# Patient Record
Sex: Female | Born: 1980 | Race: Black or African American | Hispanic: No | Marital: Single | Smoking: Never smoker
Health system: Southern US, Community
[De-identification: ages and names within clinical notes are randomized; demographics above are authoritative.]

## PROBLEM LIST (undated history)

## (undated) DIAGNOSIS — R011 Cardiac murmur, unspecified: Secondary | ICD-10-CM

## (undated) DIAGNOSIS — Z789 Other specified health status: Secondary | ICD-10-CM

## (undated) DIAGNOSIS — D500 Iron deficiency anemia secondary to blood loss (chronic): Secondary | ICD-10-CM

## (undated) HISTORY — DX: Iron deficiency anemia secondary to blood loss (chronic): D50.0

## (undated) HISTORY — PX: NO PAST SURGERIES: SHX2092

---

## 2002-05-10 ENCOUNTER — Emergency Department (HOSPITAL_COMMUNITY): Admission: EM | Admit: 2002-05-10 | Discharge: 2002-05-10 | Payer: Self-pay | Admitting: Emergency Medicine

## 2010-07-06 ENCOUNTER — Emergency Department (HOSPITAL_COMMUNITY)
Admission: EM | Admit: 2010-07-06 | Discharge: 2010-07-07 | Disposition: A | Discharge status: Home / Self Care | Payer: Managed Care, Other (non HMO) | Attending: Emergency Medicine | Admitting: Emergency Medicine

## 2010-07-06 DIAGNOSIS — S025XXA Fracture of tooth (traumatic), initial encounter for closed fracture: Secondary | ICD-10-CM | POA: Insufficient documentation

## 2010-07-06 DIAGNOSIS — S0993XA Unspecified injury of face, initial encounter: Secondary | ICD-10-CM | POA: Insufficient documentation

## 2010-07-06 DIAGNOSIS — Y92838 Other recreation area as the place of occurrence of the external cause: Secondary | ICD-10-CM | POA: Insufficient documentation

## 2010-07-06 DIAGNOSIS — IMO0002 Reserved for concepts with insufficient information to code with codable children: Secondary | ICD-10-CM | POA: Insufficient documentation

## 2010-07-06 DIAGNOSIS — R220 Localized swelling, mass and lump, head: Secondary | ICD-10-CM | POA: Insufficient documentation

## 2010-07-06 DIAGNOSIS — K089 Disorder of teeth and supporting structures, unspecified: Secondary | ICD-10-CM | POA: Insufficient documentation

## 2010-07-06 DIAGNOSIS — R51 Headache: Secondary | ICD-10-CM | POA: Insufficient documentation

## 2010-07-06 DIAGNOSIS — K0889 Other specified disorders of teeth and supporting structures: Secondary | ICD-10-CM | POA: Insufficient documentation

## 2010-07-06 DIAGNOSIS — Y9239 Other specified sports and athletic area as the place of occurrence of the external cause: Secondary | ICD-10-CM | POA: Insufficient documentation

## 2010-07-07 ENCOUNTER — Emergency Department (HOSPITAL_COMMUNITY): Payer: Managed Care, Other (non HMO)

## 2013-08-14 ENCOUNTER — Ambulatory Visit (INDEPENDENT_AMBULATORY_CARE_PROVIDER_SITE_OTHER): Payer: Managed Care, Other (non HMO)

## 2013-08-14 ENCOUNTER — Ambulatory Visit (INDEPENDENT_AMBULATORY_CARE_PROVIDER_SITE_OTHER): Payer: Managed Care, Other (non HMO) | Admitting: Sports Medicine

## 2013-08-14 ENCOUNTER — Encounter: Payer: Self-pay | Admitting: Sports Medicine

## 2013-08-14 VITALS — BP 147/87 | HR 61 | Ht 64.0 in | Wt 184.0 lb

## 2013-08-14 DIAGNOSIS — Y9364 Activity, baseball: Secondary | ICD-10-CM

## 2013-08-14 DIAGNOSIS — S6990XA Unspecified injury of unspecified wrist, hand and finger(s), initial encounter: Secondary | ICD-10-CM

## 2013-08-14 DIAGNOSIS — S6980XA Other specified injuries of unspecified wrist, hand and finger(s), initial encounter: Secondary | ICD-10-CM

## 2013-08-14 DIAGNOSIS — S6992XA Unspecified injury of left wrist, hand and finger(s), initial encounter: Secondary | ICD-10-CM | POA: Insufficient documentation

## 2013-08-14 DIAGNOSIS — IMO0002 Reserved for concepts with insufficient information to code with codable children: Secondary | ICD-10-CM

## 2013-08-14 HISTORY — DX: Unspecified injury of left wrist, hand and finger(s), initial encounter: S69.92XA

## 2013-08-14 MED ORDER — HYDROCODONE-ACETAMINOPHEN 5-325 MG PO TABS: 1.0000 | ORAL_TABLET | Freq: Three times a day (TID) | ORAL | Status: DC | PRN

## 2013-08-14 NOTE — Progress Notes (Signed)
  Subjective:    CC: Hand injury  HPI:  Several days ago this pleasant 33 year-old female was playing softball, and injured her left fifth finger while catching a ball. She had immediate pain, swelling, and deformity which she immediately corrected herself. She came to me for further evaluation and definitive treatment. Pain is severe, persistent, and she has limited function. Pain is localized predominately over the proximal interphalangeal joint.  Past medical history, Surgical history, Family history not pertinant except as noted below, Social history, Allergies, and medications have been entered into the medical record, reviewed, and no changes needed.   Review of Systems: No headache, visual changes, nausea, vomiting, diarrhea, constipation, dizziness, abdominal pain, skin rash, fevers, chills, night sweats, swollen lymph nodes, weight loss, chest pain, body aches, joint swelling, muscle aches, shortness of breath, mood changes, visual or auditory hallucinations.  Objective:    General: Well Developed, well nourished, and in no acute distress.  Neuro: Alert and oriented x3, extra-ocular muscles intact, sensation grossly intact.  HEENT: Normocephalic, atraumatic, pupils equal round reactive to light, neck supple, no masses, no lymphadenopathy, thyroid nonpalpable.  Skin: Warm and dry, no rashes noted.  Cardiac: Regular rate and rhythm, no murmurs rubs or gallops.  Respiratory: Clear to auscultation bilaterally. Not using accessory muscles, speaking in full sentences.  Abdominal: Soft, nontender, nondistended, positive bowel sounds, no masses, no organomegaly.  Left hand: There is bruising and swelling with tenderness to palpation at the proximal interphalangeal joint of the left fifth finger. She does have some strength to extension and flexion at the MCP, PIP, and PIP however it's a week and does reproduce severe pain. She is neurovascularly intact distally.  X-rays were personally  reviewed and do show a comminuted intra-articular fracture at the PIP at the base of the middle phalanx with greater than 50% articular involvement and approximately 100% dorsal subluxation of the dorsal fragment.  Impression and Recommendations:    The patient was counselled, risk factors were discussed, anticipatory guidance given.

## 2013-08-14 NOTE — Assessment & Plan Note (Addendum)
With significant pain around the proximal interphalangeal joint. I do suspect that this is fractured.  Extension splint placed, x-rays. Return in 2 weeks. Right-handed duty only at work.  Once x-rays were reviewed, I do see a severe fracture with greater than 50% involvement of the articular surface of the base of the middle phalanx, with fairly severe dorsal subluxation of the dorsal fragment. This will not do well with nonsurgical measures, I discussed this with Dr. Grandville Silos with [facility], he agrees to see her tomorrow morning at 8:30 in the morning, n.p.o., and will likely operate tomorrow afternoon. I called the patient to discuss this, and she agrees to present at [facility] n.p.o. in the morning.

## 2013-08-15 ENCOUNTER — Encounter (HOSPITAL_BASED_OUTPATIENT_CLINIC_OR_DEPARTMENT_OTHER): Payer: Self-pay | Admitting: *Deleted

## 2013-08-15 ENCOUNTER — Encounter (HOSPITAL_BASED_OUTPATIENT_CLINIC_OR_DEPARTMENT_OTHER)
Admission: RE | Disposition: A | Discharge status: Home / Self Care | Payer: Self-pay | Source: Ambulatory Visit | Attending: Orthopedic Surgery

## 2013-08-15 ENCOUNTER — Ambulatory Visit (HOSPITAL_BASED_OUTPATIENT_CLINIC_OR_DEPARTMENT_OTHER)
Admission: RE | Admit: 2013-08-15 | Discharge: 2013-08-15 | Disposition: A | Discharge status: Home / Self Care | Payer: Managed Care, Other (non HMO) | Source: Ambulatory Visit | Attending: Orthopedic Surgery | Admitting: Orthopedic Surgery

## 2013-08-15 ENCOUNTER — Ambulatory Visit (HOSPITAL_COMMUNITY): Payer: Managed Care, Other (non HMO)

## 2013-08-15 ENCOUNTER — Other Ambulatory Visit: Payer: Self-pay | Admitting: Orthopedic Surgery

## 2013-08-15 ENCOUNTER — Ambulatory Visit (HOSPITAL_BASED_OUTPATIENT_CLINIC_OR_DEPARTMENT_OTHER): Payer: Managed Care, Other (non HMO) | Admitting: Anesthesiology

## 2013-08-15 ENCOUNTER — Encounter (HOSPITAL_BASED_OUTPATIENT_CLINIC_OR_DEPARTMENT_OTHER): Payer: Managed Care, Other (non HMO) | Admitting: Anesthesiology

## 2013-08-15 DIAGNOSIS — W219XXA Striking against or struck by unspecified sports equipment, initial encounter: Secondary | ICD-10-CM | POA: Insufficient documentation

## 2013-08-15 DIAGNOSIS — S63279A Dislocation of unspecified interphalangeal joint of unspecified finger, initial encounter: Secondary | ICD-10-CM | POA: Insufficient documentation

## 2013-08-15 DIAGNOSIS — Y9239 Other specified sports and athletic area as the place of occurrence of the external cause: Secondary | ICD-10-CM | POA: Insufficient documentation

## 2013-08-15 DIAGNOSIS — Y92838 Other recreation area as the place of occurrence of the external cause: Secondary | ICD-10-CM

## 2013-08-15 HISTORY — DX: Other specified health status: Z78.9

## 2013-08-15 HISTORY — DX: Cardiac murmur, unspecified: R01.1

## 2013-08-15 HISTORY — PX: OPEN REDUCTION INTERNAL FIXATION (ORIF) METACARPAL: SHX6234

## 2013-08-15 LAB — POCT HEMOGLOBIN-HEMACUE: HEMOGLOBIN: 12.7 g/dL (ref 12.0–15.0)

## 2013-08-15 SURGERY — OPEN REDUCTION INTERNAL FIXATION (ORIF) METACARPAL
Anesthesia: General | Site: Finger | Laterality: Left

## 2013-08-15 MED ORDER — MIDAZOLAM HCL 2 MG/2ML IJ SOLN: 1.0000 mg | INTRAMUSCULAR | Status: DC | PRN

## 2013-08-15 MED ORDER — FENTANYL CITRATE 0.05 MG/ML IJ SOLN: 50.0000 ug | INTRAMUSCULAR | Status: DC | PRN

## 2013-08-15 MED ORDER — FENTANYL CITRATE 0.05 MG/ML IJ SOLN
INTRAMUSCULAR | Status: AC
  Filled 2013-08-15: qty 4

## 2013-08-15 MED ORDER — PROPOFOL 10 MG/ML IV BOLUS
INTRAVENOUS | Status: AC
  Filled 2013-08-15: qty 20

## 2013-08-15 MED ORDER — ONDANSETRON HCL 4 MG/2ML IJ SOLN
INTRAMUSCULAR | Status: DC | PRN
  Administered 2013-08-15: 4 mg via INTRAVENOUS

## 2013-08-15 MED ORDER — DEXAMETHASONE SODIUM PHOSPHATE 4 MG/ML IJ SOLN
INTRAMUSCULAR | Status: DC | PRN
  Administered 2013-08-15: 10 mg via INTRAVENOUS

## 2013-08-15 MED ORDER — FENTANYL CITRATE 0.05 MG/ML IJ SOLN
INTRAMUSCULAR | Status: DC | PRN
  Administered 2013-08-15 (×2): 25 ug via INTRAVENOUS
  Administered 2013-08-15: 100 ug via INTRAVENOUS
  Administered 2013-08-15: 25 ug via INTRAVENOUS

## 2013-08-15 MED ORDER — HYDROMORPHONE HCL PF 1 MG/ML IJ SOLN: 0.2500 mg | INTRAMUSCULAR | Status: DC | PRN

## 2013-08-15 MED ORDER — OXYCODONE HCL 5 MG PO TABS: 5.0000 mg | ORAL_TABLET | Freq: Once | ORAL | Status: DC | PRN

## 2013-08-15 MED ORDER — BUPIVACAINE-EPINEPHRINE 0.5% -1:200000 IJ SOLN
INTRAMUSCULAR | Status: DC | PRN
  Administered 2013-08-15: 10 mL

## 2013-08-15 MED ORDER — CEFAZOLIN SODIUM-DEXTROSE 2-3 GM-% IV SOLR
2.0000 g | INTRAVENOUS | Status: AC
  Administered 2013-08-15: 2 g via INTRAVENOUS

## 2013-08-15 MED ORDER — CHLORHEXIDINE GLUCONATE 4 % EX LIQD: 60.0000 mL | Freq: Once | CUTANEOUS | Status: DC

## 2013-08-15 MED ORDER — LIDOCAINE HCL (CARDIAC) 20 MG/ML IV SOLN
INTRAVENOUS | Status: DC | PRN
  Administered 2013-08-15: 60 mg via INTRAVENOUS

## 2013-08-15 MED ORDER — LACTATED RINGERS IV SOLN
INTRAVENOUS | Status: DC
  Administered 2013-08-15: 12:00:00 via INTRAVENOUS

## 2013-08-15 MED ORDER — OXYCODONE HCL 5 MG/5ML PO SOLN: 5.0000 mg | Freq: Once | ORAL | Status: DC | PRN

## 2013-08-15 MED ORDER — MIDAZOLAM HCL 5 MG/5ML IJ SOLN
INTRAMUSCULAR | Status: DC | PRN
  Administered 2013-08-15: 2 mg via INTRAVENOUS

## 2013-08-15 MED ORDER — BUPIVACAINE-EPINEPHRINE (PF) 0.5% -1:200000 IJ SOLN
INTRAMUSCULAR | Status: AC
  Filled 2013-08-15: qty 30

## 2013-08-15 MED ORDER — PROPOFOL 10 MG/ML IV BOLUS
INTRAVENOUS | Status: DC | PRN
  Administered 2013-08-15: 150 mg via INTRAVENOUS

## 2013-08-15 MED ORDER — CEFAZOLIN SODIUM-DEXTROSE 2-3 GM-% IV SOLR
INTRAVENOUS | Status: AC
  Filled 2013-08-15: qty 50

## 2013-08-15 MED ORDER — MIDAZOLAM HCL 2 MG/2ML IJ SOLN
INTRAMUSCULAR | Status: AC
  Filled 2013-08-15: qty 2

## 2013-08-15 MED ORDER — LACTATED RINGERS IV SOLN
INTRAVENOUS | Status: DC
  Administered 2013-08-15 (×2): via INTRAVENOUS

## 2013-08-15 SURGICAL SUPPLY — 47 items
BANDAGE COBAN STERILE 2 (GAUZE/BANDAGES/DRESSINGS) IMPLANT
BLADE MINI RND TIP GREEN BEAV (BLADE) IMPLANT
BLADE SURG 15 STRL LF DISP TIS (BLADE) ×1 IMPLANT
BLADE SURG 15 STRL SS (BLADE) ×3
BNDG CMPR 9X4 STRL LF SNTH (GAUZE/BANDAGES/DRESSINGS) ×1
BNDG COHESIVE 4X5 TAN STRL (GAUZE/BANDAGES/DRESSINGS) ×3 IMPLANT
BNDG ESMARK 4X9 LF (GAUZE/BANDAGES/DRESSINGS) ×3 IMPLANT
BNDG GAUZE ELAST 4 BULKY (GAUZE/BANDAGES/DRESSINGS) ×6 IMPLANT
CANISTER SUCTION 1200CC (MISCELLANEOUS) IMPLANT
CHLORAPREP W/TINT 26ML (MISCELLANEOUS) ×3 IMPLANT
CORDS BIPOLAR (ELECTRODE) ×3 IMPLANT
COVER MAYO STAND STRL (DRAPES) ×3 IMPLANT
COVER TABLE BACK 60X90 (DRAPES) ×3 IMPLANT
CUFF TOURNIQUET SINGLE 18IN (TOURNIQUET CUFF) ×2 IMPLANT
DRAPE C-ARM 42X72 X-RAY (DRAPES) ×3 IMPLANT
DRAPE EXTREMITY T 121X128X90 (DRAPE) ×3 IMPLANT
DRAPE SURG 17X23 STRL (DRAPES) ×3 IMPLANT
DRSG EMULSION OIL 3X3 NADH (GAUZE/BANDAGES/DRESSINGS) ×3 IMPLANT
GAUZE SPONGE 4X4 12PLY STRL (GAUZE/BANDAGES/DRESSINGS) ×3 IMPLANT
GLOVE BIO SURGEON STRL SZ7.5 (GLOVE) ×3 IMPLANT
GLOVE BIOGEL PI IND STRL 7.0 (GLOVE) IMPLANT
GLOVE BIOGEL PI IND STRL 8 (GLOVE) ×1 IMPLANT
GLOVE BIOGEL PI INDICATOR 7.0 (GLOVE) ×2
GLOVE BIOGEL PI INDICATOR 8 (GLOVE) ×2
GLOVE ECLIPSE 6.5 STRL STRAW (GLOVE) ×2 IMPLANT
GLOVE EXAM NITRILE PF MED BLUE (GLOVE) ×2 IMPLANT
GOWN STRL REUS W/ TWL LRG LVL3 (GOWN DISPOSABLE) ×2 IMPLANT
GOWN STRL REUS W/TWL LRG LVL3 (GOWN DISPOSABLE) ×6
GUIDEWIRE THREADED 1.6 (WIRE) ×4 IMPLANT
NEEDLE HYPO 22GX1.5 SAFETY (NEEDLE) ×2 IMPLANT
NS IRRIG 1000ML POUR BTL (IV SOLUTION) ×3 IMPLANT
PACK BASIN DAY SURGERY FS (CUSTOM PROCEDURE TRAY) ×3 IMPLANT
PADDING CAST ABS 4INX4YD NS (CAST SUPPLIES) ×2
PADDING CAST ABS COTTON 4X4 ST (CAST SUPPLIES) IMPLANT
RUBBERBAND STERILE (MISCELLANEOUS) ×2 IMPLANT
SLEEVE SCD COMPRESS KNEE MED (MISCELLANEOUS) ×3 IMPLANT
STOCKINETTE 4X48 STRL (DRAPES) ×3 IMPLANT
SUCTION FRAZIER TIP 10 FR DISP (SUCTIONS) IMPLANT
SUT VICRYL RAPIDE 4-0 (SUTURE) IMPLANT
SUT VICRYL RAPIDE 4/0 PS 2 (SUTURE) ×2 IMPLANT
SYR BULB 3OZ (MISCELLANEOUS) ×2 IMPLANT
SYRINGE 10CC LL (SYRINGE) ×2 IMPLANT
TOWEL OR 17X24 6PK STRL BLUE (TOWEL DISPOSABLE) ×3 IMPLANT
TOWEL OR NON WOVEN STRL DISP B (DISPOSABLE) IMPLANT
TUBE CONNECTING 20'X1/4 (TUBING)
TUBE CONNECTING 20X1/4 (TUBING) IMPLANT
UNDERPAD 30X30 INCONTINENT (UNDERPADS AND DIAPERS) ×3 IMPLANT

## 2013-08-15 NOTE — Progress Notes (Signed)
No hx of infection

## 2013-08-15 NOTE — Discharge Instructions (Signed)
Discharge Instructions   You have a dressing with a plaster splint incorporated in it. Move your fingers as much as possible, making a full fist and fully opening the fist. Elevate your hand to reduce pain & swelling of the digits.  Ice over the operative site may be helpful to reduce pain & swelling.  DO NOT USE HEAT. Pain medicine has been prescribed for you.  Use your medicine as needed over the first 48 hours, and then you can begin to taper your use.  You may use Tylenol in place of your prescribed pain medication, but not IN ADDITION to it. Leave the dressing in place until you return to our office.  You may shower, but keep the bandage clean & dry.  You may drive a car when you are off of prescription pain medications and can safely control your vehicle with both hands. Our office will call you to arrange follow-up   Please call [phone] during normal business hours or [phone] after hours for any problems. Including the following:  - excessive redness of the incisions - drainage for more than 4 days - fever of more than 101.5 F  *Please note that pain medications will not be refilled after hours or on weekends.  NO WORK THRU 08-19-13, RETURNING TO WORK ON 08-20-13, BUT WITH THESE LEFT HAND RESTRICTIONS: NO LIFTING, GRIPPING, OR GRASPING MORE THAN PAPER/PENCIL TASKS   Post Anesthesia Home Care Instructions  Activity: Get plenty of rest for the remainder of the day. A responsible adult should stay with you for 24 hours following the procedure.  For the next 24 hours, DO NOT: -Drive a car -Paediatric nurse -Drink alcoholic beverages -Take any medication unless instructed by your physician -Make any legal decisions or sign important papers.  Meals: Start with liquid foods such as gelatin or soup. Progress to regular foods as tolerated. Avoid greasy, spicy, heavy foods. If nausea and/or vomiting occur, drink only clear liquids until the nausea and/or vomiting subsides.  Call your physician if vomiting continues.  Special Instructions/Symptoms: Your throat may feel dry or sore from the anesthesia or the breathing tube placed in your throat during surgery. If this causes discomfort, gargle with warm salt water. The discomfort should disappear within 24 hours.

## 2013-08-15 NOTE — H&P (Signed)
Grace Robertson is an 33 y.o. female.   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  She was evaluated and splinted, noted to have a PIP fracture dislocation.  Past Medical History  Diagnosis Date  . Medical history non-contributory   . Heart murmur     as baby    Past Surgical History  Procedure Laterality Date  . No past surgeries      History reviewed. No pertinent family history. Social History:  reports that she has never smoked. She has never used smokeless tobacco. She reports that she drinks alcohol. She reports that she does not use illicit drugs.  Allergies: No Known Allergies  Medications Prior to Admission  Medication Sig Dispense Refill  . HYDROcodone-acetaminophen (NORCO/VICODIN) 5-325 MG per tablet Take 1 tablet by mouth every 8 (eight) hours as needed for moderate pain.  40 tablet  0    No results found for this or any previous visit (from the past 48 hour(s)). Dg Finger Little Left  08/14/2013   CLINICAL DATA:  33 year old female status post blunt trauma with pain. Initial encounter.  EXAM: LEFT LITTLE FINGER 2+V  COMPARISON:  None.  FINDINGS: Comminuted fracture dislocation at the left fifth PIP. Intra-articular fracture with dorsal displacement of 1 full shaft with and impaction on the head of the fifth proximal phalanxa. Butterfly fragment with volar displacement. The DIP remains intact. No definite fracture at the head of the proximal phalanx. The fifth MCP is intact.  IMPRESSION: Comminuted fracture dislocation of the left fifth PIP, impacted and with dorsal displacement of 1 full shaft width.   Electronically Signed   By: Lars Pinks M.D.   On: 08/14/2013  16:03    Review of Systems  All other systems reviewed and are negative.   Blood pressure 147/113, pulse 75, temperature 98 F (36.7 C), temperature source Oral, resp. rate 20, height 5\' 4"  (1.626 m), weight 83.462 kg (184 lb), last menstrual period 07/14/2013, SpO2 97.00%. Physical Exam  Constitutional:  WD, WN, NAD HEENT:  NCAT, EOMI Neuro/Psych:  Alert & oriented to person, place, and time; appropriate mood & affect Lymphatic: No generalized UE edema or lymphadenopathy Extremities / MSK:  Both UE are normal with respect to appearance, ranges of motion, joint stability, muscle strength/tone, sensation, & perfusion except as otherwise noted:  Left small finger is swollen at the PIP, NVI, splint applied, grossly normal alignment & motion not attempted  Labs / Xrays:  No radiographic studies obtained today.  X-rays from yesterday are reviewed revealing a fracture dislocation of the PIP joint, with a rather large and somewhat comminuted volar fracture off the base of the middle phalanx and proximal dorsal dislocation of the remainder of the middle phalanx.  Assessment: Left small finger PIP fracture dislocation  Plan:  I discussed in fairly great detail the complexity and gravity of this injury.  We reviewed multiple methods of treatment, as well as the goals of restoring joint stability, articular congruity, and motion.  Methods such as the force couple splint, static pinning of the PIP joint in reduced position, and open methods of articular reconstruction were discussed, including hemi-hamate reconstruction.  It is my preference if  possible to employ the force couple splint, as I think in her situation it offers the best possible means for achieving all 3 objectives.  We will plan to proceed today at the [facility].    The details of the operative procedure were discussed with the patient.  Questions were invited and answered.  In addition to the goal of the procedure, the  risks of the procedure to include but not limited to bleeding; infection; damage to the nerves or blood vessels that could result in bleeding, numbness, weakness, chronic pain, and the need for additional procedures; stiffness; the need for revision surgery; and anesthetic risks, the worst of which is death, were reviewed.  No specific outcome was guaranteed or implied.  Informed consent was obtained.   Randee Upchurch A. 08/15/2013, 11:12 AM

## 2013-08-15 NOTE — Anesthesia Preprocedure Evaluation (Addendum)
Anesthesia Evaluation  Patient identified by MRN, date of birth, ID band Patient awake    Reviewed: Allergy & Precautions, H&P , NPO status , Patient's Chart, lab work & pertinent test results  Airway Mallampati: I  TM Distance: >3 FB Neck ROM: Full    Dental no notable dental hx. (+) Teeth Intact, Dental Advisory Given   Pulmonary neg pulmonary ROS,  breath sounds clear to auscultation  Pulmonary exam normal       Cardiovascular negative cardio ROS  Rhythm:Regular Rate:Normal     Neuro/Psych negative neurological ROS  negative psych ROS   GI/Hepatic negative GI ROS, Neg liver ROS,   Endo/Other  negative endocrine ROS  Renal/GU negative Renal ROS  negative genitourinary   Musculoskeletal   Abdominal   Peds  Hematology negative hematology ROS (+)   Anesthesia Other Findings   Reproductive/Obstetrics negative OB ROS                            Anesthesia Physical Anesthesia Plan  ASA: II  Anesthesia Plan: General   Post-op Pain Management:    Induction: Intravenous  Airway Management Planned: LMA  Additional Equipment:   Intra-op Plan:   Post-operative Plan: Extubation in OR  Informed Consent: I have reviewed the patients History and Physical, chart, labs and discussed the procedure including the risks, benefits and alternatives for the proposed anesthesia with the patient or authorized representative who has indicated his/her understanding and acceptance.   Dental advisory given  Plan Discussed with: CRNA  Anesthesia Plan Comments:         Anesthesia Quick Evaluation  

## 2013-08-15 NOTE — Anesthesia Postprocedure Evaluation (Signed)
  Anesthesia Post-op Note  Patient: Grace Robertson  Procedure(s) Performed: Procedure(s): OPEN REDUCTION INTERNAL FIXATION (ORIF) LEFT SMALL FINGER (Left)  Patient Location: PACU  Anesthesia Type:General  Level of Consciousness: awake and alert   Airway and Oxygen Therapy: Patient Spontanous Breathing  Post-op Pain: none  Post-op Assessment: Post-op Vital signs reviewed, Patient's Cardiovascular Status Stable and Respiratory Function Stable  Post-op Vital Signs: Reviewed  Filed Vitals:   08/15/13 1400  BP: 134/89  Pulse: 81  Temp:   Resp: 15    Complications: No apparent anesthesia complications

## 2013-08-15 NOTE — Transfer of Care (Signed)
Immediate Anesthesia Transfer of Care Note  Patient: Grace Robertson  Procedure(s) Performed: Procedure(s): OPEN REDUCTION INTERNAL FIXATION (ORIF) LEFT SMALL FINGER (Left)  Patient Location: PACU  Anesthesia Type:General  Level of Consciousness: awake, sedated and patient cooperative  Airway & Oxygen Therapy: Patient Spontanous Breathing and Patient connected to face mask oxygen  Post-op Assessment: Report given to PACU RN and Post -op Vital signs reviewed and stable  Post vital signs: Reviewed and stable  Complications: No apparent anesthesia complications

## 2013-08-15 NOTE — Op Note (Signed)
08/15/2013  12:38 PM  PATIENT:  Grace Robertson  33 y.o. female  PRE-OPERATIVE DIAGNOSIS:  Left small finger PIP fracture dislocation  POST-OPERATIVE DIAGNOSIS:  Same  PROCEDURE:  Application of multiplane or external fixation left small finger for treatment of left small finger PIP fracture dislocation (Agee Force-Couple Splint Fabrication)  SURGEON: Rayvon Char. Grandville Silos, MD  PHYSICIAN ASSISTANT: None  ANESTHESIA:  general  SPECIMENS:  None  DRAINS:   None  PREOPERATIVE INDICATIONS:  Grace Robertson is a  33 y.o. female with left small finger PIP fracture dislocation, with a rather large and slightly comminuted volar fracture component at the base of the middle phalanx  The risks benefits and alternatives were discussed with the patient preoperatively including but not limited to the risks of infection, bleeding, nerve injury, cardiopulmonary complications, the need for revision surgery, among others, and the patient verbalized understanding and consented to proceed.  OPERATIVE IMPLANTS: 0.045 inch K wire x2, and 0.062 inch threaded K wire x1  OPERATIVE PROCEDURE:  After receiving prophylactic antibiotics, the patient was escorted to the operative theatre and placed in a supine position.  General anesthesia was administered. A surgical "time-out" was performed during which the planned procedure, proposed operative site, and the correct patient identity were compared to the operative consent and agreement confirmed by the circulating nurse according to current facility policy.  Following application of a tourniquet to the operative extremity, the exposed skin was prepped with Chloraprep and draped in the usual sterile fashion.    Fluoroscopy was used to assess the adequacy of closed reduction. Closed reduction was actually quite good, But not stable. Decision was made to proceed with fabrication of an Agee Force-Couple splint. Using fluoroscopic guidance, 0.045 inch K wire was driven  through the head of the proximal phalanx in an effort to place it along the axis of rotation. Another was driven through the dorsal middle phalanx, near the proximal diametaphyseal junction. The last was a 0.062 inch threaded K wire driven from dorsal to volar more distally into the middle phalanx.  The K wires were appropriately bent into the splint design, and a rubber band applied. Tension was adjusted by determining how many redundant wraps that there would be within the rubber band.  Final images were obtained, revealing concentric reduction with good articular alignment both in the flexed and extended positions. Xeroform was placed around the pin sites and a bulky hand dressing was applied, leaving the pins exposed so that they would not catch upon the Kerlex.Digital block consisting of half percent Marcaine with epinephrine was instilled and the patient was awakened taken to recovery in stable condition, breathing spontaneously.  DISPOSITION: The patient be discharged home today with instructions to begin to work on active and passive range of motion, returning next week for reassessment. She should have evaluation under fluoroscopy at that visit.

## 2013-08-16 ENCOUNTER — Encounter (HOSPITAL_BASED_OUTPATIENT_CLINIC_OR_DEPARTMENT_OTHER): Payer: Self-pay | Admitting: Orthopedic Surgery

## 2013-08-28 ENCOUNTER — Ambulatory Visit: Payer: Managed Care, Other (non HMO) | Admitting: Sports Medicine

## 2019-03-05 ENCOUNTER — Other Ambulatory Visit: Payer: Self-pay

## 2019-03-05 ENCOUNTER — Encounter: Payer: Self-pay | Admitting: Osteopathic Medicine

## 2019-03-05 ENCOUNTER — Ambulatory Visit (INDEPENDENT_AMBULATORY_CARE_PROVIDER_SITE_OTHER): Payer: Managed Care, Other (non HMO) | Admitting: Osteopathic Medicine

## 2019-03-05 VITALS — BP 136/89 | HR 103 | Temp 98.7°F | Ht 64.0 in | Wt 191.0 lb

## 2019-03-05 DIAGNOSIS — Z832 Family history of diseases of the blood and blood-forming organs and certain disorders involving the immune mechanism: Secondary | ICD-10-CM | POA: Diagnosis not present

## 2019-03-05 DIAGNOSIS — D509 Iron deficiency anemia, unspecified: Secondary | ICD-10-CM

## 2019-03-05 DIAGNOSIS — F418 Other specified anxiety disorders: Secondary | ICD-10-CM | POA: Diagnosis not present

## 2019-03-05 DIAGNOSIS — D649 Anemia, unspecified: Secondary | ICD-10-CM | POA: Diagnosis not present

## 2019-03-05 DIAGNOSIS — N852 Hypertrophy of uterus: Secondary | ICD-10-CM

## 2019-03-05 MED ORDER — DIAZEPAM 5 MG PO TABS: 5.0000 mg | ORAL_TABLET | Freq: Once | ORAL | 0 refills | Status: AC

## 2019-03-05 NOTE — Patient Instructions (Addendum)
Plan:  Labs now!  Probably will need iron infusion   +/- blood transfusion depending on labs  You'll get a call from the [facility]  Will plan for pelvic/Pap w/ pelvic ultrasound sometime in next few weeks

## 2019-03-05 NOTE — Progress Notes (Signed)
HPI: Grace Robertson is a 38 y.o. female who  has a past medical history of Heart murmur and Medical history non-contributory.  [facility],  for chief complaint of: New to establish  Borderline BP Blood work    Anemia ion recent labs, Hgb 7.4 (pt has it on her phone, no official report printed). No other major abnormalities on labs. Pt notes SOB on exertion and fatigue. Mom and sister also have history of iron deficiency anemia requiring routine transfusions few times per year.      Past medical, surgical, social and family history reviewed:  Patient Active Problem List   Diagnosis Date Noted  . Injury of fifth finger, left 08/14/2013    Past Surgical History:  Procedure Laterality Date  . NO PAST SURGERIES    . OPEN REDUCTION INTERNAL FIXATION (ORIF) METACARPAL Left 08/15/2013   Procedure: OPEN REDUCTION INTERNAL FIXATION (ORIF) LEFT SMALL FINGER;  Surgeon: Jolyn Nap, MD;  Location: [facility];  Service: Orthopedics;  Laterality: Left;    Social History   Tobacco Use  . Smoking status: Never Smoker  . Smokeless tobacco: Never Used  Substance Use Topics  . Alcohol use: Yes    Comment: social    No family history on file.   Current medication list and allergy/intolerance information reviewed:    Current Outpatient Medications  Medication Sig Dispense Refill  . HYDROcodone-acetaminophen (NORCO/VICODIN) 5-325 MG per tablet Take 1 tablet by mouth every 8 (eight) hours as needed for moderate pain. (Patient not taking: Reported on 03/05/2019) 40 tablet 0   No current facility-administered medications for this visit.    No Known Allergies    Review of Systems:  Constitutional:  No  fever, no chills, No recent illness, No unintentional weight changes. +significant fatigue.   HEENT: No  headache, no vision change, no hearing change, No sore throat, No  sinus pressure  Cardiac:  No  chest pain, No  pressure, No palpitations, No  Orthopnea  Respiratory:  No  shortness of breath. No  Cough  Gastrointestinal: No  abdominal pain, No  nausea, No  vomiting,  No  blood in stool, No  diarrhea, No  constipation   Musculoskeletal: No new myalgia/arthralgia  Skin: No  Rash, No other wounds/concerning lesions  Genitourinary: No  incontinence, No  abnormal genital bleeding, No abnormal genital discharge  Hem/Onc: No  easy bruising/bleeding, No  abnormal lymph node  Endocrine: No cold intolerance,  No heat intolerance. No polyuria/polydipsia/polyphagia   Neurologic: No  weakness, No  dizziness, No  slurred speech/focal weakness/facial droop  Psychiatric: No  concerns with depression, No  concerns with anxiety, No sleep problems, No mood problems  Exam:  BP 136/89 (BP Location: Left Arm, Patient Position: Sitting, Cuff Size: Normal)   Pulse (!) 103   Temp 98.7 F (37.1 C) (Oral)   Ht 5\' 4"  (1.626 m)   Wt 191 lb 0.6 oz (86.7 kg)   BMI 32.79 kg/m   Constitutional: VS see above. General Appearance: alert, well-developed, well-nourished, NAD  Eyes: Normal lids and conjunctive, non-icteric sclera  Ears, Nose, Mouth, Throat: MMM, Normal external inspection ears/nares/mouth/lips/gums. TM normal bilaterally. Pharynx/tonsils no erythema, no exudate. Nasal mucosa normal.   Neck: No masses, trachea midline. No thyroid enlargement. No tenderness/mass appreciated. No lymphadenopathy  Respiratory: Normal respiratory effort. no wheeze, no rhonchi, no rales  Cardiovascular: S1/S2 normal, no murmur, no rub/gallop auscultated. RRR. No lower extremity edema. Pedal  pulse II/IV bilaterally DP and PT. No carotid bruit or JVD. No abdominal aortic bruit.  Gastrointestinal: Nontender, (+)suprapubic nontender mass/fullness. No hepatomegaly, no splenomegaly. No hernia appreciated. Bowel sounds normal. Rectal exam deferred.   Musculoskeletal: Gait normal. No clubbing/cyanosis of digits.    Neurological: Normal balance/coordination. No tremor. No cranial nerve deficit on limited exam. Motor and sensation intact and symmetric. Cerebellar reflexes intact.   Skin: warm, dry, intact. No rash/ulcer. No concerning nevi or subq nodules on limited exam.    Psychiatric: Normal judgment/insight. Normal mood and affect. Oriented x3.    Results for orders placed or performed in visit on 03/05/19 (from the past 72 hour(s))  CBC W/ DIFF.     Status: Abnormal   Collection Time: 03/05/19  5:00 PM  Result Value Ref Range   WBC 4.2 3.8 - 10.8 Thousand/uL   RBC 4.35 3.80 - 5.10 Million/uL   Hemoglobin 6.8 (L) 11.7 - 15.5 g/dL    Comment: Verified by repeat analysis. Marland Kitchen    HCT 26.5 (L) 35.0 - 45.0 %   MCV 60.9 (L) 80.0 - 100.0 fL   MCH 15.6 (L) 27.0 - 33.0 pg   MCHC 25.7 (L) 32.0 - 36.0 g/dL   RDW 18.3 (H) 11.0 - 15.0 %   Platelets 496 (H) 140 - 400 Thousand/uL   MPV 9.9 7.5 - 12.5 fL   Neutro Abs 1,999 1,500 - 7,800 cells/uL   Lymphs Abs 1,495 850 - 3,900 cells/uL   Absolute Monocytes 605 200 - 950 cells/uL   Eosinophils Absolute 71 15 - 500 cells/uL   Basophils Absolute 29 0 - 200 cells/uL   Neutrophils Relative % 47.6 %   Total Lymphocyte 35.6 %   Monocytes Relative 14.4 %   Eosinophils Relative 1.7 %   Basophils Relative 0.7 %  Fe+TIBC+Fer     Status: Abnormal   Collection Time: 03/05/19  5:00 PM  Result Value Ref Range   Iron 10 (L) 40 - 190 mcg/dL    Comment: Verified by repeat analysis. Marland Kitchen    TIBC 414 250 - 450 mcg/dL (calc)   %SAT 2 (L) 16 - 45 % (calc)   Ferritin 2 (L) 16 - 154 ng/mL  CBC MORPHOLOGY     Status: None   Collection Time: 03/05/19  5:00 PM  Result Value Ref Range   CBC MORPHOLOGY  NORMAL    Comment: Anisocytosis 1 + Microcytosis 1 + Hypochromasia 1 +     No results found.   ASSESSMENT/PLAN: The primary encounter diagnosis was Iron deficiency anemia, unspecified iron deficiency anemia type. Diagnoses of Anemia, unspecified type, Family history  of anemia, Situational anxiety, and Enlarged uterus were also pertinent to this visit.   03/06/19 after lab review - Will refer to [place] looks like need for iron/blood transfusion. Chronic, not emergent but let's try to get her taken care of ASAP!  Monitor HTN  Suspect enlarged uterus based on abdominal exam, pt overdue for Pap/pelvic anyway, has deferred thus far d/t anxiety.   Orders Placed This Encounter  Procedures  . US Transvaginal Non-OB  . US Pelvis Complete  . CBC W/ DIFF.  Marland Kitchen Fe+TIBC+Fer  . Thalassemia and Hemoglobinopathy Comprehensive Evaluation  . CBC MORPHOLOGY  . Ambulatory referral to Hematology    Meds ordered this encounter  Medications  . diazepam (VALIUM) 5 MG tablet    Sig: Take 1 tablet (5 mg total) by mouth once for 1 dose. Take 1 hour prior to pap/pelvic exam  Dispense:  1 tablet    Refill:  0    Patient Instructions  Plan:  Labs now!  Probably will need iron infusion   +/- blood transfusion depending on labs  You'll get a call from the [facility]  Will plan for pelvic/Pap w/ pelvic ultrasound sometime in next few weeks            Visit summary with medication list and pertinent instructions was printed for patient to review. All questions at time of visit were answered - patient instructed to contact office with any additional concerns or updates. ER/RTC precautions were reviewed with the patient.     Please note: voice recognition software was used to produce this document, and typos may escape review. Please contact Dr. Sheppard Coil for any needed clarifications.     Follow-up plan: Return in about 2 weeks (around 03/19/2019) for Pap / pelvic exam w/ ultrasound .

## 2019-03-06 ENCOUNTER — Other Ambulatory Visit: Payer: Self-pay | Admitting: Family

## 2019-03-06 ENCOUNTER — Telehealth: Payer: Self-pay | Admitting: Adult Health

## 2019-03-06 DIAGNOSIS — D509 Iron deficiency anemia, unspecified: Secondary | ICD-10-CM

## 2019-03-07 NOTE — Telephone Encounter (Signed)
Received a fax from [facility] about the low hemoglobin. I did try to call patient this morning to see how she is doing, no answer, left message for a return call.

## 2019-03-07 NOTE — Telephone Encounter (Signed)
Already addressed, can we call patient and confirm she's heard from [facility] / transfusion plan in place? Looks like there is an appointment for next week. If severe weakness or trouble breathing before then, patient should go to ER

## 2019-03-08 NOTE — Telephone Encounter (Signed)
Left another message asking patient to return call to let us know how she is feeling.

## 2019-03-13 ENCOUNTER — Other Ambulatory Visit: Payer: Self-pay | Admitting: Family

## 2019-03-13 DIAGNOSIS — D649 Anemia, unspecified: Secondary | ICD-10-CM

## 2019-03-14 ENCOUNTER — Other Ambulatory Visit: Payer: Self-pay

## 2019-03-14 ENCOUNTER — Telehealth: Payer: Self-pay | Admitting: Family

## 2019-03-14 ENCOUNTER — Encounter: Payer: Self-pay | Admitting: Family

## 2019-03-14 ENCOUNTER — Inpatient Hospital Stay: Payer: Managed Care, Other (non HMO)

## 2019-03-14 ENCOUNTER — Inpatient Hospital Stay: Payer: Managed Care, Other (non HMO) | Attending: Family | Admitting: Family

## 2019-03-14 DIAGNOSIS — D500 Iron deficiency anemia secondary to blood loss (chronic): Secondary | ICD-10-CM | POA: Diagnosis present

## 2019-03-14 DIAGNOSIS — N921 Excessive and frequent menstruation with irregular cycle: Secondary | ICD-10-CM

## 2019-03-14 DIAGNOSIS — N920 Excessive and frequent menstruation with regular cycle: Secondary | ICD-10-CM | POA: Diagnosis not present

## 2019-03-14 DIAGNOSIS — D649 Anemia, unspecified: Secondary | ICD-10-CM

## 2019-03-14 LAB — CBC WITH DIFFERENTIAL (CANCER CENTER ONLY)
Abs Immature Granulocytes: 0 10*3/uL (ref 0.00–0.07)
Basophils Absolute: 0 10*3/uL (ref 0.0–0.1)
Basophils Relative: 1 %
Eosinophils Absolute: 0 10*3/uL (ref 0.0–0.5)
Eosinophils Relative: 1 %
HCT: 27.3 % — ABNORMAL LOW (ref 36.0–46.0)
Hemoglobin: 7.1 g/dL — ABNORMAL LOW (ref 12.0–15.0)
Immature Granulocytes: 0 %
Lymphocytes Relative: 34 %
Lymphs Abs: 1 10*3/uL (ref 0.7–4.0)
MCH: 16 pg — ABNORMAL LOW (ref 26.0–34.0)
MCHC: 26 g/dL — ABNORMAL LOW (ref 30.0–36.0)
MCV: 61.5 fL — ABNORMAL LOW (ref 80.0–100.0)
Monocytes Absolute: 0.5 10*3/uL (ref 0.1–1.0)
Monocytes Relative: 17 %
Neutro Abs: 1.4 10*3/uL — ABNORMAL LOW (ref 1.7–7.7)
Neutrophils Relative %: 47 %
Platelet Count: 355 10*3/uL (ref 150–400)
RBC: 4.44 MIL/uL (ref 3.87–5.11)
RDW: 19.5 % — ABNORMAL HIGH (ref 11.5–15.5)
WBC Count: 2.9 10*3/uL — ABNORMAL LOW (ref 4.0–10.5)
nRBC: 0 % (ref 0.0–0.2)

## 2019-03-14 LAB — VITAMIN B12: Vitamin B-12: 394 pg/mL (ref 180–914)

## 2019-03-14 LAB — RETICULOCYTES
Immature Retic Fract: 15.6 % (ref 2.3–15.9)
RBC.: 4.48 MIL/uL (ref 3.87–5.11)
Retic Count, Absolute: 40.3 10*3/uL (ref 19.0–186.0)
Retic Ct Pct: 0.9 % (ref 0.4–3.1)

## 2019-03-14 LAB — LACTATE DEHYDROGENASE: LDH: 186 U/L (ref 98–192)

## 2019-03-14 LAB — SAMPLE TO BLOOD BANK

## 2019-03-14 LAB — FOLATE: Folate: 14.3 ng/mL (ref >5.9)

## 2019-03-14 NOTE — Progress Notes (Signed)
Hematology/Oncology Consultation   Name: Grace Robertson      MRN: [id number]    Location: Room/bed info not found  Date: 03/14/2019 Time:1:51 PM   REFERRING PHYSICIAN: Emeterio Reeve, DO  REASON FOR CONSULT: Iron deficiency anemia    DIAGNOSIS: Iron deficiency anemia   HISTORY OF PRESENT ILLNESS:  Ms. Grace Robertson is a very pleasant African American female with history of iron deficiency anemia. Iron saturation last week was 2% and ferritin 2. Hgb today is 7.1.  She is symptomatic with fatigue and chewing lots of ice.  She states that her mother and sister both have anemia as well and have required regular IV iron infusions.  No sickle cell disease or trait.  She has a regular cycle that is heavy the first few days lasting up to 7 days.  She states that she is scheduled for a transvaginal US next week on Friday.  She has not noted any other blood loss. No bruising or petechiae.  No fever, chills, n/v, cough, rash, dizziness, SOB, chest pain, palpitations, abdominal pain or changes in bowel or bladder habits.  She has had surgery on her left picky finger for a softball injury and had no complications. No personal history of cancer. Family history includes: paternal great uncle - colon, paternal grandmother - breast and paternal third cousin - breast.   No swelling, tenderness, numbness or tingling in her extremities.  No falls or syncopal episodes.  She walks for exercise.  She has maintained a good appetite and is staying well hydrated. Her weight is stable.  She does not smoke, drink alcoholic beverages or use recreation drugs.  She works as a Chief of Staff.   ROS: All other 10 point review of systems is negative.   PAST MEDICAL HISTORY:   Past Medical History:  Diagnosis Date  . Heart murmur    as baby  . Medical history non-contributory     ALLERGIES: No Known Allergies    MEDICATIONS:  Current Outpatient Medications on File Prior to Visit  Medication Sig Dispense  Refill  . HYDROcodone-acetaminophen (NORCO/VICODIN) 5-325 MG per tablet Take 1 tablet by mouth every 8 (eight) hours as needed for moderate pain. (Patient not taking: Reported on 03/05/2019) 40 tablet 0   No current facility-administered medications on file prior to visit.     PAST SURGICAL HISTORY Past Surgical History:  Procedure Laterality Date  . NO PAST SURGERIES    . OPEN REDUCTION INTERNAL FIXATION (ORIF) METACARPAL Left 08/15/2013   Procedure: OPEN REDUCTION INTERNAL FIXATION (ORIF) LEFT SMALL FINGER;  Surgeon: Jolyn Nap, MD;  Location: [facility];  Service: Orthopedics;  Laterality: Left;    FAMILY HISTORY: Family History  Problem Relation Age of Onset  . High blood pressure Father   . High blood pressure Paternal Grandmother   . Diabetes Paternal Grandmother   . Stroke Paternal Grandmother     SOCIAL HISTORY:  reports that she has never smoked. She has never used smokeless tobacco. She reports current alcohol use of about 1.0 - 2.0 standard drinks of alcohol per week. She reports that she does not use drugs.  PERFORMANCE STATUS: The patient's performance status is 1 - Symptomatic but completely ambulatory  PHYSICAL EXAM: Most Recent Vital Signs: Blood pressure (!) 139/99, pulse 94, temperature 97.8 F (36.6 C), temperature source Temporal, resp. rate 18, weight 187 lb (84.8 kg), SpO2 100 %. BP (!) 139/99 (Patient Position: Sitting)   Pulse 94   Temp 97.8 F (36.6  C) (Temporal)   Resp 18   Wt 187 lb (84.8 kg)   SpO2 100%   BMI 32.10 kg/m   General Appearance:    Alert, cooperative, no distress, appears stated age  Head:    Normocephalic, without obvious abnormality, atraumatic  Eyes:    PERRL, conjunctiva/corneas clear, EOM's intact, fundi    benign, both eyes        Throat:   Lips, mucosa, and tongue normal; teeth and gums normal  Neck:   Supple, symmetrical, trachea midline, no adenopathy;    thyroid:  no  enlargement/tenderness/nodules; no carotid   bruit or JVD  Back:     Symmetric, no curvature, ROM normal, no CVA tenderness  Lungs:     Clear to auscultation bilaterally, respirations unlabored  Chest Wall:    No tenderness or deformity   Heart:    Regular rate and rhythm, S1 and S2 normal, no murmur, rub   or gallop     Abdomen:     Soft, non-tender, bowel sounds active all four quadrants,    no masses, no organomegaly        Extremities:   Extremities normal, atraumatic, no cyanosis or edema  Pulses:   2+ and symmetric all extremities  Skin:   Skin color, texture, turgor normal, no rashes or lesions  Lymph nodes:   Cervical, supraclavicular, and axillary nodes normal  Neurologic:   CNII-XII intact, normal strength, sensation and reflexes    throughout    LABORATORY DATA:  Results for orders placed or performed in visit on 03/14/19 (from the past 48 hour(s))  CBC with Differential (Cancer Center Only)     Status: Abnormal   Collection Time: 03/14/19  1:18 PM  Result Value Ref Range   WBC Count 2.9 (L) 4.0 - 10.5 K/uL   RBC 4.44 3.87 - 5.11 MIL/uL   Hemoglobin 7.1 (L) 12.0 - 15.0 g/dL    Comment: Reticulocyte Hemoglobin testing may be clinically indicated, consider ordering this additional test [id number]    HCT 27.3 (L) 36.0 - 46.0 %   MCV 61.5 (L) 80.0 - 100.0 fL   MCH 16.0 (L) 26.0 - 34.0 pg   MCHC 26.0 (L) 30.0 - 36.0 g/dL   RDW 19.5 (H) 11.5 - 15.5 %   Platelet Count 355 150 - 400 K/uL   nRBC 0.0 0.0 - 0.2 %   Neutrophils Relative % 47 %   Neutro Abs 1.4 (L) 1.7 - 7.7 K/uL   Lymphocytes Relative 34 %   Lymphs Abs 1.0 0.7 - 4.0 K/uL   Monocytes Relative 17 %   Monocytes Absolute 0.5 0.1 - 1.0 K/uL   Eosinophils Relative 1 %   Eosinophils Absolute 0.0 0.0 - 0.5 K/uL   Basophils Relative 1 %   Basophils Absolute 0.0 0.0 - 0.1 K/uL   Immature Granulocytes 0 %   Abs Immature Granulocytes 0.00 0.00 - 0.07 K/uL    Comment: Performed at [facility] at  [facility], [address] [place], [place] [address]  Reticulocytes     Status: None   Collection Time: 03/14/19  1:18 PM  Result Value Ref Range   Retic Ct Pct 0.9 0.4 - 3.1 %   RBC. 4.48 3.87 - 5.11 MIL/uL   Retic Count, Absolute 40.3 19.0 - 186.0 K/uL   Immature Retic Fract 15.6 2.3 - 15.9 %    Comment: Performed at [facility] at [facility], [address], [place], [place] [address]      RADIOGRAPHY: No results found.     PATHOLOGY: None   ASSESSMENT/PLAN: Ms. Munir is a very pleasant African American female with history of iron deficiency anemia secondary to heavy cycles. We will get her set up for 5 cycles of IV Venofer and follow-up in 8 weeks to re-evaluate her response.  She has her TV US next week and is working with gynecology to help reduce her cycle.   All questions were answered and she is in agreement with the plan. She will contact our office with questions or concerns. We can certainly see her sooner if needed.   She was discussed with and also seen by Dr. Marin Olp and he is in agreement with the aforementioned.   Laverna Peace, NP    Addendum: I saw Ms. Aase with Judson Roch.  She is very nice.  I suspect that she does have iron deficiency anemia.  Her ferritin was only 2 her iron saturation was 2%.  Again, she is having heavy cycles.  I am sure that her gynecologist will help with this.  It sounds like she might need to have some type of procedure done to help stop the heavy cycles.  I looked at her blood under the microscope.  Her blood smear showed some microcytic red blood cells.  I Solik occasional target cell.  She had some anisocytosis and poikilocytosis.  I feel that we will be able to improve her hemoglobin.  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  We need to make sure that she is feeling better and more energetic so that she can do her job.  We spent about 45 minutes with  her.  All the time spent face-to-face.  We went over her lab work with her.  We explained our recommendations.  We answered her questions.  She is in agreement.  We will plan to get her back to see [place] in about 4 weeks ago.  We should be able to see her MCV, and up nicely along with her hemoglobin.  Lattie Haw, MD

## 2019-03-14 NOTE — Telephone Encounter (Signed)
Called and LMVm for patient with updated appointments that have been scheduled. I asked that she call back to confirm. Per 1/6 los

## 2019-03-15 ENCOUNTER — Encounter: Payer: Self-pay | Admitting: Family

## 2019-03-15 ENCOUNTER — Other Ambulatory Visit: Payer: Self-pay | Admitting: Family

## 2019-03-15 DIAGNOSIS — N921 Excessive and frequent menstruation with irregular cycle: Secondary | ICD-10-CM | POA: Insufficient documentation

## 2019-03-15 LAB — ERYTHROPOIETIN: Erythropoietin: 240.3 m[IU]/mL — ABNORMAL HIGH (ref 2.6–18.5)

## 2019-03-17 ENCOUNTER — Encounter: Payer: Self-pay | Admitting: Family

## 2019-03-21 ENCOUNTER — Ambulatory Visit (INDEPENDENT_AMBULATORY_CARE_PROVIDER_SITE_OTHER): Payer: Managed Care, Other (non HMO)

## 2019-03-21 ENCOUNTER — Other Ambulatory Visit: Payer: Self-pay

## 2019-03-21 ENCOUNTER — Encounter: Payer: Self-pay | Admitting: Osteopathic Medicine

## 2019-03-21 DIAGNOSIS — D259 Leiomyoma of uterus, unspecified: Secondary | ICD-10-CM

## 2019-03-21 DIAGNOSIS — N852 Hypertrophy of uterus: Secondary | ICD-10-CM

## 2019-03-21 DIAGNOSIS — N921 Excessive and frequent menstruation with irregular cycle: Secondary | ICD-10-CM

## 2019-03-21 DIAGNOSIS — D649 Anemia, unspecified: Secondary | ICD-10-CM

## 2019-03-21 DIAGNOSIS — D509 Iron deficiency anemia, unspecified: Secondary | ICD-10-CM

## 2019-03-22 ENCOUNTER — Ambulatory Visit: Payer: Managed Care, Other (non HMO)

## 2019-03-23 ENCOUNTER — Other Ambulatory Visit: Payer: Managed Care, Other (non HMO)

## 2019-03-27 ENCOUNTER — Ambulatory Visit (INDEPENDENT_AMBULATORY_CARE_PROVIDER_SITE_OTHER): Payer: Managed Care, Other (non HMO) | Admitting: Osteopathic Medicine

## 2019-03-27 ENCOUNTER — Other Ambulatory Visit: Payer: Self-pay

## 2019-03-27 ENCOUNTER — Encounter: Payer: Self-pay | Admitting: Osteopathic Medicine

## 2019-03-27 VITALS — BP 137/97 | HR 94 | Temp 99.0°F | Wt 187.1 lb

## 2019-03-27 DIAGNOSIS — D509 Iron deficiency anemia, unspecified: Secondary | ICD-10-CM

## 2019-03-27 DIAGNOSIS — N921 Excessive and frequent menstruation with irregular cycle: Secondary | ICD-10-CM | POA: Diagnosis not present

## 2019-03-27 DIAGNOSIS — D259 Leiomyoma of uterus, unspecified: Secondary | ICD-10-CM

## 2019-03-27 NOTE — Progress Notes (Signed)
Grace Robertson is a 39 y.o. female who presents to  [facility]  today, 03/27/19, seeking care for the following:     [place] with other pertinent history/findings:   The primary encounter diagnosis was Uterine leiomyoma, unspecified location. Diagnoses of Menometrorrhagia and Iron deficiency anemia, unspecified iron deficiency anemia type were also pertinent to this visit.  Reviewed results  Advised OBGYN visit to discuss options Pt reports she does NOT desire pregnancy  OK to continue iron infusions for now      Follow-up instructions: Return for recheck pending OBGYN assessment .        There were no vitals taken for this visit.  No outpatient medications have been marked as taking for the 03/27/19 encounter (Appointment) with Emeterio Reeve, DO.    No results found for this or any previous visit (from the past 72 hour(s)).  No results found.  Depression screen [facility] 2/9 03/05/2019  Decreased Interest 0  Down, Depressed, Hopeless 0  PHQ - 2 Score 0  Altered sleeping 0  Tired, decreased energy 2  Change in appetite 0  Feeling bad or failure about yourself  0  Trouble concentrating 0  Moving slowly or fidgety/restless 0  Suicidal thoughts 0  PHQ-9 Score 2  Difficult doing work/chores Somewhat difficult    GAD 7 : Generalized Anxiety Score 03/05/2019  Nervous, Anxious, on Edge 0  Control/stop worrying 0  Worry too much - different things 0  Trouble relaxing 0  Restless 0  Easily annoyed or irritable 0  Afraid - awful might happen 0  Total GAD 7 Score 0      All questions at time of visit were answered - patient instructed to contact office with any additional concerns or updates.  ER/RTC precautions were reviewed with the patient.  Please note: voice recognition software was used to produce this document, and typos may escape review. Please contact Dr. Sheppard Coil for any needed  clarifications.   Total encounter time: 15  minutes.

## 2019-03-28 ENCOUNTER — Ambulatory Visit: Payer: Managed Care, Other (non HMO)

## 2019-03-30 ENCOUNTER — Ambulatory Visit: Payer: Managed Care, Other (non HMO)

## 2019-03-30 LAB — IRON,TIBC AND FERRITIN PANEL
%SAT: 2 % (calc) — ABNORMAL LOW (ref 16–45)
Ferritin: 2 ng/mL — ABNORMAL LOW (ref 16–154)
Iron: 10 ug/dL — ABNORMAL LOW (ref 40–190)
TIBC: 414 mcg/dL (calc) (ref 250–450)

## 2019-03-30 LAB — ALPHA THAL MUT ANALYSIS

## 2019-03-30 LAB — CBC WITH DIFFERENTIAL/PLATELET
Absolute Monocytes: 605 cells/uL (ref 200–950)
Basophils Absolute: 29 cells/uL (ref 0–200)
Basophils Relative: 0.7 %
Eosinophils Absolute: 71 cells/uL (ref 15–500)
Eosinophils Relative: 1.7 %
HCT: 26.5 % — ABNORMAL LOW (ref 35.0–45.0)
Hemoglobin: 6.8 g/dL — ABNORMAL LOW (ref 11.7–15.5)
Lymphs Abs: 1495 cells/uL (ref 850–3900)
MCH: 15.6 pg — ABNORMAL LOW (ref 27.0–33.0)
MCHC: 25.7 g/dL — ABNORMAL LOW (ref 32.0–36.0)
MCV: 60.9 fL — ABNORMAL LOW (ref 80.0–100.0)
MPV: 9.9 fL (ref 7.5–12.5)
Monocytes Relative: 14.4 %
Neutro Abs: 1999 cells/uL (ref 1500–7800)
Neutrophils Relative %: 47.6 %
Platelets: 496 10*3/uL — ABNORMAL HIGH (ref 140–400)
RBC: 4.35 10*6/uL (ref 3.80–5.10)
RDW: 18.3 % — ABNORMAL HIGH (ref 11.0–15.0)
Total Lymphocyte: 35.6 %
WBC: 4.2 10*3/uL (ref 3.8–10.8)

## 2019-03-30 LAB — THALASSEMIA AND HEMOGLOBINOPATHY COMPREHENSIVE EVALUATION
Ferritin: 3 ng/mL — ABNORMAL LOW (ref 16–154)
Fetal Hemoglobin Testing: 0 % (ref <2.0)
HCT: 25.9 % — ABNORMAL LOW (ref 35.0–45.0)
Hemoglobin A2 - HGBRFX: 1.6 % — ABNORMAL LOW (ref 1.8–3.5)
Hemoglobin: 6.9 g/dL — ABNORMAL LOW (ref 11.7–15.5)
Hgb A: 98.4 % (ref >96.0)
MCH: 16 pg — ABNORMAL LOW (ref 27.0–33.0)
MCHC: 26.6 g/dL — ABNORMAL LOW (ref 32.0–36.0)
MCV: 60.2 FL — ABNORMAL LOW (ref 80.0–100.0)
RBC: 4.3 10*6/uL (ref 3.80–5.10)
RDW: 19.9 % — ABNORMAL HIGH (ref 11.0–15.0)

## 2019-03-30 LAB — CBC MORPHOLOGY

## 2019-03-30 LAB — GENO PHENO REVIEW

## 2019-04-02 ENCOUNTER — Ambulatory Visit: Payer: Managed Care, Other (non HMO)

## 2019-04-04 ENCOUNTER — Inpatient Hospital Stay: Payer: Managed Care, Other (non HMO)

## 2019-04-04 ENCOUNTER — Other Ambulatory Visit: Payer: Self-pay

## 2019-04-04 ENCOUNTER — Ambulatory Visit: Payer: Managed Care, Other (non HMO)

## 2019-04-04 VITALS — BP 114/79 | HR 79 | Temp 97.8°F | Resp 16

## 2019-04-04 DIAGNOSIS — D509 Iron deficiency anemia, unspecified: Secondary | ICD-10-CM

## 2019-04-04 DIAGNOSIS — D500 Iron deficiency anemia secondary to blood loss (chronic): Secondary | ICD-10-CM | POA: Diagnosis not present

## 2019-04-04 MED ORDER — SODIUM CHLORIDE 0.9 % IV SOLN
200.0000 mg | Freq: Once | INTRAVENOUS | Status: AC
  Administered 2019-04-04: 14:00:00 200 mg via INTRAVENOUS
  Filled 2019-04-04: qty 200

## 2019-04-04 MED ORDER — SODIUM CHLORIDE 0.9 % IV SOLN
Freq: Once | INTRAVENOUS | Status: AC
  Filled 2019-04-04: qty 250

## 2019-04-04 NOTE — Patient Instructions (Signed)

## 2019-04-09 ENCOUNTER — Other Ambulatory Visit: Payer: Self-pay | Admitting: Obstetrics & Gynecology

## 2019-04-09 ENCOUNTER — Other Ambulatory Visit: Payer: Self-pay

## 2019-04-09 ENCOUNTER — Encounter: Payer: Self-pay | Admitting: Obstetrics & Gynecology

## 2019-04-09 ENCOUNTER — Ambulatory Visit (INDEPENDENT_AMBULATORY_CARE_PROVIDER_SITE_OTHER): Payer: Managed Care, Other (non HMO) | Admitting: Obstetrics & Gynecology

## 2019-04-09 VITALS — BP 143/98 | HR 80 | Temp 98.2°F | Resp 16 | Ht 64.0 in | Wt 183.0 lb

## 2019-04-09 DIAGNOSIS — Z01411 Encounter for gynecological examination (general) (routine) with abnormal findings: Secondary | ICD-10-CM | POA: Diagnosis not present

## 2019-04-09 DIAGNOSIS — Z3202 Encounter for pregnancy test, result negative: Secondary | ICD-10-CM

## 2019-04-09 DIAGNOSIS — B9689 Other specified bacterial agents as the cause of diseases classified elsewhere: Secondary | ICD-10-CM

## 2019-04-09 DIAGNOSIS — N898 Other specified noninflammatory disorders of vagina: Secondary | ICD-10-CM

## 2019-04-09 DIAGNOSIS — D500 Iron deficiency anemia secondary to blood loss (chronic): Secondary | ICD-10-CM

## 2019-04-09 DIAGNOSIS — Z124 Encounter for screening for malignant neoplasm of cervix: Secondary | ICD-10-CM | POA: Diagnosis not present

## 2019-04-09 DIAGNOSIS — Z1151 Encounter for screening for human papillomavirus (HPV): Secondary | ICD-10-CM | POA: Diagnosis not present

## 2019-04-09 DIAGNOSIS — D250 Submucous leiomyoma of uterus: Secondary | ICD-10-CM

## 2019-04-09 DIAGNOSIS — N760 Acute vaginitis: Secondary | ICD-10-CM

## 2019-04-09 DIAGNOSIS — D251 Intramural leiomyoma of uterus: Secondary | ICD-10-CM

## 2019-04-09 DIAGNOSIS — B373 Candidiasis of vulva and vagina: Secondary | ICD-10-CM

## 2019-04-09 DIAGNOSIS — D259 Leiomyoma of uterus, unspecified: Secondary | ICD-10-CM | POA: Insufficient documentation

## 2019-04-09 LAB — POCT URINE PREGNANCY: Preg Test, Ur: NEGATIVE

## 2019-04-09 NOTE — Progress Notes (Signed)
   Subjective:    Patient ID: Grace Robertson, female    DOB: 1980/05/13, 39 y.o.   MRN: [id number]  HPI  39 yo female presents with iron deficiency anemia, menorrhagia with mostly normal cycle (sometimes skips a month), and enlarged fibroid uterus. Pt hs not taken any meds or had intervention for this.  Her mother had a hysterectomy for fibroids and anemia.  She has felt her abdomen enlarged for several years.  Pt does not like coming to the [place] office and would like to get everything doen today she can for preparation for hysterectomy.    Review of Systems  Constitutional: Negative.   Respiratory: Negative.   Cardiovascular: Negative.   Genitourinary: Positive for menstrual problem and vaginal bleeding. Negative for dyspareunia.  Psychiatric/Behavioral: Negative.        Objective:   Physical Exam Vitals reviewed.  Constitutional:      General: She is not in acute distress.    Appearance: She is well-developed.  HENT:     Head: Normocephalic and atraumatic.  Eyes:     Conjunctiva/sclera: Conjunctivae normal.  Cardiovascular:     Rate and Rhythm: Normal rate.  Pulmonary:     Effort: Pulmonary effort is normal.  Abdominal:     Comments: Enlarged uterus to umbilicus  Genitourinary:    General: Normal vulva.     Vagina: Vaginal discharge present.  Skin:    General: Skin is warm and dry.  Neurological:     Mental Status: She is alert and oriented to person, place, and time.    Vitals:   04/09/19 0852  BP: (!) 143/98  Pulse: 80  Resp: 16  Temp: 98.2 F (36.8 C)  Weight: 183 lb (83 kg)  Height: 5\' 4"  (1.626 m)       Assessment & Plan:  39 yo female with enlarged uterus, anemia, and menorrhagia.  1.  Pap smear with co testing 2.  Endometrial biopsy 3.  Return on Feb 11th with Dr. Elly Modena to discuss hysterectomy.  We reveiwed medication, IUD, [place] today and pt leaning towards hysterectomy. 4.  F/U with PCP if BP remains increased.  Pt is nervous today (first  pelvic exam).  ENDOMETRIAL BIOPSY     The indications for endometrial biopsy were reviewed.   Risks of the biopsy including cramping, bleeding, infection, uterine perforation, inadequate specimen and need for additional procedures  were discussed. The patient states she understands and agrees to undergo procedure today. Consent was signed. Time out was performed. Urine HCG was negative. A sterile speculum was placed in the patient's vagina and the cervix was prepped with Betadine. A single-toothed tenaculum was placed on the anterior lip of the cervix to stabilize it. The 3 mm pipelle was introduced into the endometrial cavity without difficulty to a depth of 8 cm, and a moderate amount of tissue was obtained and sent to pathology. The instruments were removed from the patient's vagina. Minimal bleeding from the cervix was noted. The patient tolerated the procedure well. Routine post-procedure instructions were given to the patient. The patient will follow up to review the results and for further management.

## 2019-04-09 NOTE — Patient Instructions (Signed)
Hysterectomy Information  A hysterectomy is a surgery in which the uterus is removed. The fallopian tubes and ovaries may be removed (bilateral salpingo-oophorectomy) as well. This procedure may be done to treat various medical problems. After the procedure, a woman will no longer have menstrual periods nor will she be able to become pregnant (sterile). What are the reasons for a hysterectomy? There are many reasons why a woman might have this procedure. They include:  Persistent, abnormal vaginal bleeding.  Long-term (chronic) pelvic pain or infection.  Endometriosis. This is when the lining of the uterus (endometrium) starts to grow outside the uterus.  Adenomyosis. This is when the endometrium starts to grow in the muscle of the uterus.  Pelvic organ prolapse. This is a condition in which the uterus falls down into the vagina.  Noncancerous growths in the uterus (uterine fibroids) that cause symptoms.  The presence of precancerous cells.  Cervical or uterine cancer. What are the different types of hysterectomy? There are three different types of hysterectomy:  Supracervical hysterectomy. In this type, the top part of the uterus is removed, but not the cervix.  Total hysterectomy. In this type, the uterus and cervix are removed.  Radical hysterectomy. In this type, the uterus, the cervix, and the tissue that holds the uterus in place (parametrium) are removed. What are the different ways a hysterectomy can be performed? There are many different ways a hysterectomy can be performed, including:  Abdominal hysterectomy. In this type, an incision is made in the abdomen. The uterus is removed through this incision.  Vaginal hysterectomy. In this type, an incision is made in the vagina. The uterus is removed through this incision. There are no abdominal incisions.  Conventional laparoscopic hysterectomy. In this type, three or four small incisions are made in the abdomen. A thin,  lighted tube with a camera (laparoscope) is inserted into one of the incisions. Other tools are put through the other incisions. The uterus is cut into small pieces. The small pieces are removed through the incisions or through the vagina.  Laparoscopically assisted vaginal hysterectomy (LAVH). In this type, three or four small incisions are made in the abdomen. Part of the surgery is performed laparoscopically and the other part is done vaginally. The uterus is removed through the vagina.  Robot-assisted laparoscopic hysterectomy. In this type, a laparoscope and other tools are inserted into three or four small incisions in the abdomen. A computer-controlled device is used to give the surgeon a 3D image and to help control the surgical instruments. This allows for more precise movements of surgical instruments. The uterus is cut into small pieces and removed through the incisions or removed through the vagina. Discuss the options with your health care provider to determine which type is the right one for you. What are the risks? Generally, this is a safe procedure. However, problems may occur, including:  Bleeding and risk of blood transfusion. Tell your health care provider if you do not want to receive any blood products.  Blood clots in the legs or lung.  Infection.  Damage to other structures or organs.  Allergic reactions to medicines.  Changing to an abdominal hysterectomy from one of the other techniques. What to expect after a hysterectomy  You will be given pain medicine.  You may need to stay in the hospital for 1- 2 days to recover, depending on the type of hysterectomy you had.  Follow your health care provider's instructions about exercise, driving, and general activities. Ask your   health care provider what activities are safe for you.  You will need to have someone with you for the first 3-5 days after you go home.  You will need to follow up with your surgeon in 2-4  weeks after surgery to evaluate your progress.  If the ovaries are removed, you will have early menopause symptoms such as hot flashes, night sweats, and insomnia.  If you had a hysterectomy for a problem that was not cancer or not a condition that could lead to cancer, then you no longer need Pap tests. However, even if you no longer need a Pap test, a regular pelvic exam is a good idea to make sure no other problems are developing. Questions to ask your health care provider  Is a hysterectomy medically necessary? Do I have other treatment options for my condition?  What are my options for hysterectomy procedure?  What organs and tissues need to be removed?  What are the risks?  What are the benefits?  How long will I need to stay in the hospital after the procedure?  How long will I need to recover at home?  What symptoms can I expect after the procedure? Summary  A hysterectomy is a surgery in which the uterus is removed. The fallopian tubes and ovaries may be removed (bilateral salpingo-oophorectomy) as well.  This procedure may be done to treat various medical problems. After the procedure, a woman will no longer have menstrual periods nor will she be able to become pregnant.  Discuss the options with your health care provider to determine which type of hysterectomy is the right one for you. This information is not intended to replace advice given to you by your health care provider. Make sure you discuss any questions you have with your health care provider. Document Revised: 02/04/2017 Document Reviewed: 03/31/2016 Elsevier Patient Education  2020 [employer]. Uterine Artery Embolization for Fibroids  Uterine artery embolization is a procedure to shrink uterine fibroids. Uterine fibroids are masses of tissue (tumors) that can develop in the womb (uterus). They are also called leiomyomas. This type of tumor is not cancerous (benign) and does not spread to other parts of the  body outside of the pelvic area. The pelvic area is the part of the body between the hip bones. You can have one or many fibroids. Fibroids can vary in size, shape, weight, and where they grow in the uterus. Some can become quite large. In this procedure, a thin plastic tube (catheter) is used to inject a chemical that blocks off the blood supply to the fibroid, which causes the fibroid to shrink. Tell a health care provider about:  Any allergies you have.  All medicines you are taking, including vitamins, herbs, eye drops, creams, and over-the-counter medicines.  Any problems you or family members have had with anesthetic medicines.  Any blood disorders you have.  Any surgeries you have had.  Any medical conditions you have.  Whether you are pregnant or may be pregnant. What are the risks? Generally, this is a safe procedure. However, problems may occur, including:  Bleeding.  Allergic reactions to medicines or dyes.  Damage to other structures or organs.  Infection, including blood infection (septicemia).  Injury to the uterus from decreased blood supply.  Lack of menstrual periods (amenorrhea).  Death of tissue cells (necrosis) around your bladder or vulva.  Development of a hole between organs or from an organ to the surface of your skin (fistula).  Blood clot in the  legs (deep vein thrombosis) or lung (pulmonary embolus).  Nausea and vomiting. What happens before the procedure? Staying hydrated Follow instructions from your health care provider about hydration, which may include:  Up to 2 hours before the procedure - you may continue to drink clear liquids, such as water, clear fruit juice, black coffee, and plain tea. Eating and drinking restrictions Follow instructions from your health care provider about eating and drinking, which may include:  8 hours before the procedure - stop eating heavy meals or foods such as meat, fried foods, or fatty foods.  6 hours  before the procedure - stop eating light meals or foods, such as toast or cereal.  6 hours before the procedure - stop drinking milk or drinks that contain milk.  2 hours before the procedure - stop drinking clear liquids. Medicines  Ask your health care provider about: ? Changing or stopping your regular medicines. This is especially important if you are taking diabetes medicines or blood thinners. ? Taking over-the-counter medicines, vitamins, herbs, and supplements. ? Taking medicines such as aspirin and ibuprofen. These medicines can thin your blood. Do not take these medicines unless your health care provider tells you to take them.  You may be given antibiotic medicine to help prevent infection.  You may be given medicine to prevent nausea and vomiting (antiemetic). General instructions  Ask your health care provider how your surgical site will be marked or identified.  You may be asked to shower with a germ-killing soap.  Plan to have someone take you home from the hospital or clinic.  If you will be going home right after the procedure, plan to have someone with you for 24 hours.  You will be asked to empty your bladder. What happens during the procedure?  To lower your risk of infection: ? Your health care team will wash or sanitize their hands. ? Hair may be removed from the surgical area. ? Your skin will be washed with soap.  An IV will be inserted into one of your veins.  You will be given one or more of the following: ? A medicine to help you relax (sedative). ? A medicine to numb the area (local anesthetic).  A small cut (incision) will be made in your groin.  A catheter will be inserted into the main artery of your leg. The catheter will be guided through the artery to your uterus.  A series of images will be taken while dye is injected through the catheter in your groin. X-rays are taken at the same time. This is done to provide a road map of the blood  supply to your uterus and fibroids.  Tiny plastic spheres, about the size of sand grains, will be injected through the catheter. Metal coils may be used to help block the artery. The particles will lodge in tiny branches of the uterine artery that supplies blood to the fibroids.  The procedure will be repeated on the artery that supplies the other side of the uterus.  The catheter will be removed and pressure will be applied to stop the bleeding.  A dressing will be placed over the incision. The procedure may vary among health care providers and hospitals. What happens after the procedure?  Your blood pressure, heart rate, breathing rate, and blood oxygen level will be monitored until the medicines you were given have worn off.  You will be given pain medicine as needed.  You may be given medicine for nausea and vomiting as  needed.  Do not drive for 24 hours if you were given a sedative. Summary  Uterine artery embolization is a procedure to shrink uterine fibroids by blocking the blood supply to the fibroid.  You may be given a sedative and local anesthetic for the procedure.  A catheter will be inserted into the main artery of your leg. The catheter will be guided through the artery to your uterus.  After the procedure you will be given pain medicine and medicine for nausea as needed.  Do not drive for 24 hours if you were given a sedative. This information is not intended to replace advice given to you by your health care provider. Make sure you discuss any questions you have with your health care provider. Document Revised: 02/04/2017 Document Reviewed: 05/27/2016 Elsevier Patient Education  2020 [employer].

## 2019-04-10 LAB — CERVICOVAGINAL ANCILLARY ONLY
Bacterial Vaginitis (gardnerella): POSITIVE — AB
Candida Glabrata: POSITIVE — AB
Candida Vaginitis: NEGATIVE
Comment: NEGATIVE
Comment: NEGATIVE
Comment: NEGATIVE

## 2019-04-10 LAB — CYTOLOGY - PAP
Comment: NEGATIVE
Diagnosis: NEGATIVE
High risk HPV: NEGATIVE

## 2019-04-11 ENCOUNTER — Telehealth: Payer: Self-pay

## 2019-04-11 ENCOUNTER — Other Ambulatory Visit: Payer: Self-pay

## 2019-04-11 ENCOUNTER — Inpatient Hospital Stay: Payer: Managed Care, Other (non HMO) | Attending: Family

## 2019-04-11 VITALS — BP 147/91 | HR 82 | Temp 97.1°F | Resp 17

## 2019-04-11 DIAGNOSIS — D509 Iron deficiency anemia, unspecified: Secondary | ICD-10-CM

## 2019-04-11 DIAGNOSIS — N920 Excessive and frequent menstruation with regular cycle: Secondary | ICD-10-CM | POA: Diagnosis not present

## 2019-04-11 DIAGNOSIS — D500 Iron deficiency anemia secondary to blood loss (chronic): Secondary | ICD-10-CM | POA: Diagnosis present

## 2019-04-11 MED ORDER — SODIUM CHLORIDE 0.9 % IV SOLN
200.0000 mg | Freq: Once | INTRAVENOUS | Status: AC
  Administered 2019-04-11: 15:00:00 200 mg via INTRAVENOUS
  Filled 2019-04-11: qty 200

## 2019-04-11 MED ORDER — SODIUM CHLORIDE 0.9 % IV SOLN
Freq: Once | INTRAVENOUS | Status: AC
  Filled 2019-04-11: qty 250

## 2019-04-11 NOTE — Telephone Encounter (Addendum)
Spoke with pt and she is aware of results below and that Flagyl and Borid Acid capsules have been called in to [facility].   ----- Message from Guss Bunde, MD sent at 04/11/2019  1:10 PM EST ----- Bacterial vaginosis to be treated with oral flagyl and candida glabrata to be treated with boric acid capsules 600mg  nightly for 14 days.  Rx sent to pharmacy (must be compounded).  RN to call patient to discuss which pharmacy will we send the Rx to.

## 2019-04-11 NOTE — Patient Instructions (Signed)

## 2019-04-18 ENCOUNTER — Inpatient Hospital Stay: Payer: Managed Care, Other (non HMO)

## 2019-04-18 ENCOUNTER — Other Ambulatory Visit: Payer: Self-pay

## 2019-04-18 VITALS — BP 138/95 | HR 82 | Temp 97.5°F | Resp 17

## 2019-04-18 DIAGNOSIS — D509 Iron deficiency anemia, unspecified: Secondary | ICD-10-CM

## 2019-04-18 DIAGNOSIS — D500 Iron deficiency anemia secondary to blood loss (chronic): Secondary | ICD-10-CM | POA: Diagnosis not present

## 2019-04-18 MED ORDER — SODIUM CHLORIDE 0.9 % IV SOLN
Freq: Once | INTRAVENOUS | Status: AC
  Filled 2019-04-18: qty 250

## 2019-04-18 MED ORDER — SODIUM CHLORIDE 0.9 % IV SOLN
200.0000 mg | Freq: Once | INTRAVENOUS | Status: AC
  Administered 2019-04-18: 200 mg via INTRAVENOUS
  Filled 2019-04-18: qty 10

## 2019-04-18 NOTE — Progress Notes (Signed)
Pt declined to stay for the full 30 minute post infusion observation period. Pt verbalized understanding to seek emergency care if needed and had no further questions. Pt left clinic ambulatory in no apparent distress.

## 2019-04-19 ENCOUNTER — Ambulatory Visit: Payer: Managed Care, Other (non HMO) | Admitting: Obstetrics and Gynecology

## 2019-04-25 ENCOUNTER — Other Ambulatory Visit: Payer: Self-pay

## 2019-04-25 ENCOUNTER — Inpatient Hospital Stay: Payer: Managed Care, Other (non HMO)

## 2019-04-25 VITALS — BP 126/89 | HR 82

## 2019-04-25 DIAGNOSIS — D509 Iron deficiency anemia, unspecified: Secondary | ICD-10-CM

## 2019-04-25 DIAGNOSIS — D500 Iron deficiency anemia secondary to blood loss (chronic): Secondary | ICD-10-CM | POA: Diagnosis not present

## 2019-04-25 MED ORDER — SODIUM CHLORIDE 0.9 % IV SOLN
Freq: Once | INTRAVENOUS | Status: AC
  Filled 2019-04-25: qty 250

## 2019-04-25 MED ORDER — SODIUM CHLORIDE 0.9 % IV SOLN
200.0000 mg | Freq: Once | INTRAVENOUS | Status: AC
  Administered 2019-04-25: 200 mg via INTRAVENOUS
  Filled 2019-04-25: qty 200

## 2019-04-25 NOTE — Patient Instructions (Signed)

## 2019-04-30 ENCOUNTER — Encounter: Payer: Self-pay | Admitting: Obstetrics & Gynecology

## 2019-04-30 ENCOUNTER — Other Ambulatory Visit: Payer: Self-pay

## 2019-04-30 ENCOUNTER — Ambulatory Visit (INDEPENDENT_AMBULATORY_CARE_PROVIDER_SITE_OTHER): Payer: Managed Care, Other (non HMO) | Admitting: Obstetrics & Gynecology

## 2019-04-30 VITALS — BP 138/95 | HR 79 | Temp 97.3°F | Ht 64.0 in | Wt 183.0 lb

## 2019-04-30 DIAGNOSIS — D500 Iron deficiency anemia secondary to blood loss (chronic): Secondary | ICD-10-CM

## 2019-04-30 DIAGNOSIS — N920 Excessive and frequent menstruation with regular cycle: Secondary | ICD-10-CM | POA: Diagnosis not present

## 2019-04-30 DIAGNOSIS — D259 Leiomyoma of uterus, unspecified: Secondary | ICD-10-CM

## 2019-04-30 MED ORDER — TRANEXAMIC ACID 650 MG PO TABS: 1300.0000 mg | ORAL_TABLET | Freq: Three times a day (TID) | ORAL | 5 refills | Status: DC

## 2019-04-30 MED ORDER — NAPROXEN 500 MG PO TABS: 500.0000 mg | ORAL_TABLET | Freq: Two times a day (BID) | ORAL | 2 refills | Status: DC

## 2019-04-30 NOTE — Patient Instructions (Signed)
Abdominal Hysterectomy Abdominal hysterectomy is a surgical procedure to remove the womb (uterus). The uterus is the muscular organ that houses a developing baby. This surgery may be done if:  You have cancer.  You have growths (tumors or fibroids) in the uterus.  You have long-term (chronic) pain.  You are bleeding.  Your uterus has slipped down into your vagina (uterine prolapse).  You have a condition in which the tissue that lines the uterus grows outside of its normal location (endometriosis).  You have an infection in your uterus.  You are having problems with your menstrual cycle. Depending on why you are having this procedure, you may also have other reproductive organs removed. These could include:  The part of your vagina that connects with your uterus (cervix).  The organs that make eggs (ovaries).  The tubes that connect the ovaries to the uterus (fallopian tubes). Tell a health care provider about:  Any allergies you have.  All medicines you are taking, including vitamins, herbs, eye drops, creams, and over-the-counter medicines.  Any problems you or family members have had with anesthetic medicines.  Any blood disorders you have.  Any surgeries you have had.  Any medical conditions you have.  Whether you are pregnant or may be pregnant. What are the risks? Generally, this is a safe procedure. However, problems may occur, including:  Bleeding.  Infection.  Allergic reactions to medicines or dyes.  Damage to other structures or organs.  Nerve injury.  Decreased interest in sex or pain during sex.  Blood clots that can break free and travel to your lungs. What happens before the procedure? Staying hydrated Follow instructions from your health care provider about hydration, which may include:  Up to 2 hours before the procedure - you may continue to drink clear liquids, such as water, clear fruit juice, black coffee, and plain tea Eating and  drinking restrictions Follow instructions from your health care provider about eating and drinking, which may include:  8 hours before the procedure - stop eating heavy meals or foods such as meat, fried foods, or fatty foods.  6 hours before the procedure - stop eating light meals or foods, such as toast or cereal.  6 hours before the procedure - stop drinking milk or drinks that contain milk.  2 hours before the procedure - stop drinking clear liquids. Medicines  Ask your health care provider about: ? Changing or stopping your regular medicines. This is especially important if you are taking diabetes medicines or blood thinners. ? Taking medicines such as aspirin and ibuprofen. These medicines can thin your blood. Do not take these medicines before your procedure if your health care provider instructs you not to.  You may be given antibiotic medicine to help prevent infection. Take it as told by your health care provider.  You may be asked to take laxatives to prevent constipation. General instructions  Ask your health care provider how your surgical site will be marked or identified.  You may be asked to shower with a germ-killing soap.  Plan to have someone take you home from the hospital.  Do not use any products that contain nicotine or tobacco, such as cigarettes and e-cigarettes. If you need help quitting, ask your health care provider.  You may have an exam or testing.  You may have a blood or urine sample taken.  You may need to have an enema to clean out your rectum and lower colon.  This procedure can affect the way   you feel about yourself. Talk to your health care provider about the physical and emotional changes this procedure may cause. What happens during the procedure?  To lower your risk of infection: ? Your health care team will wash or sanitize their hands. ? Your skin will be washed with soap. ? Hair may be removed from the surgical area.  An IV tube  will be inserted into one of your veins.  You will be given one or more of the following: ? A medicine to help you relax (sedative). ? A medicine to make you fall asleep (general anesthetic).  Tight-fitting (compression) stockings will be placed on your legs to promote circulation.  A thin, flexible tube (catheter) will be inserted to help drain your urine.  The surgeon will make a cut (incision) through the skin in your lower belly. The incision may go side-to-side or up-and-down.  The surgeon will move aside the body tissue that covers your uterus. The surgeon will then carefully take out your uterus along with any of the other organs that need to be removed.  Bleeding will be controlled with clamps or sutures.  The surgeon will close your incision with stitches (sutures), skin glue, or adhesive strips.  A bandage (dressing) will be placed over the incision. The procedure may vary among health care providers and hospitals. What happens after the procedure?  You will be given pain medicine as needed.  Your blood pressure, heart rate, breathing rate, and blood oxygen level will be monitored until the medicines you were given have worn off.  You will need to stay in the hospital to recover for one to two days. Ask your health care provider how long you will need to stay in the hospital after your procedure.  You may have a liquid diet at first. You will most likely return to your usual diet the day after surgery.  You will still have the urinary catheter in place. It will likely be removed the day after surgery.  You may have to wear compression stockings. These stockings help to prevent blood clots and reduce swelling in your legs.  You will be encouraged to walk as soon as possible. You will also use a device or do breathing exercises to keep your lungs clear.  You may need to use a sanitary napkin for vaginal discharge. Summary  Abdominal hysterectomy is a surgical procedure  to remove the womb (uterus). The uterus is the muscular organ that houses a developing baby.  This procedure can affect the way you feel about yourself. Talk to your health care provider about the physical and emotional changes this procedure may cause.  You will be given medicines for pain after the procedure.  You will need to stay in the hospital to recover. Ask your health care provider how long you will need to stay in the hospital after your procedure. This information is not intended to replace advice given to you by your health care provider. Make sure you discuss any questions you have with your health care provider. Document Revised: 03/29/2018 Document Reviewed: 02/11/2016 Elsevier Patient Education  2020 [employer]  

## 2019-04-30 NOTE — Progress Notes (Signed)
GYNECOLOGY OFFICE CONSULT NOTE  History:   Grace Robertson is a 39 y.o. [id number] here today for discussion about surgical management of symptomatic uterine fibroids that cause abnormal uterine bleeding and anemia. She desires definitve surgical management with hysterectomy.  She denies any current abnormal vaginal discharge, bleeding, pelvic pain or other concerns.    Past Medical History:  Diagnosis Date  . Heart murmur    as baby  . Iron deficiency anemia due to chronic blood loss   . Menometrorrhagia 03/15/2019    Past Surgical History:  Procedure Laterality Date  . OPEN REDUCTION INTERNAL FIXATION (ORIF) METACARPAL Left 08/15/2013   Procedure: OPEN REDUCTION INTERNAL FIXATION (ORIF) LEFT SMALL FINGER;  Surgeon: Jolyn Nap, MD;  Location: [facility];  Service: Orthopedics;  Laterality: Left;    The following portions of the patient's history were reviewed and updated as appropriate: allergies, current medications, past family history, past medical history, past social history, past surgical history and problem list.   Health Maintenance:  Normal pap and negative HRHPV on 04/09/2019.    Review of Systems:  Pertinent items noted in HPI and remainder of comprehensive ROS otherwise negative.  Physical Exam:  BP (!) 138/95   Pulse 79   Temp (!) 97.3 F (36.3 C)   Ht 5\' 4"  (1.626 m)   Wt 183 lb (83 kg)   BMI 31.41 kg/m  CONSTITUTIONAL: Well-developed, well-nourished female in no acute distress.  HEENT:  Normocephalic, atraumatic. External right and left ear normal. No scleral icterus.  NECK: Normal range of motion, supple, no masses noted on observation SKIN: No rash noted. Not diaphoretic. No erythema. No pallor. MUSCULOSKELETAL: Normal range of motion. No edema noted. NEUROLOGIC: Alert and oriented to person, place, and time. Normal muscle tone coordination. No cranial nerve deficit noted. PSYCHIATRIC: Normal mood and affect. Normal behavior. Normal  judgment and thought content. CARDIOVASCULAR: Normal heart rate noted RESPIRATORY: Effort and breath sounds normal, no problems with respiration noted ABDOMEN: 20 week size globular uterus palpated. No other masses noted. No other overt distention noted.  Nontender, soft. PELVIC: Deferred  Labs  CBC Latest Ref Rng & Units 03/14/2019 03/05/2019 03/05/2019  WBC 4.0 - 10.5 K/uL 2.9(L) - 4.2  Hemoglobin 12.0 - 15.0 g/dL 7.1(L) 6.9(L) 6.8(L)  Hematocrit 36.0 - 46.0 % 27.3(L) 25.9(L) 26.5(L)  Platelets 150 - 400 K/uL 355 - 496(H)   04/09/2019 Pap smear NIELM, negative HPV. 04/09/2019 Endometrium, biopsy - PROLIFERATIVE ENDOMETRIUM. NO HYPERPLASIA OR MALIGNANCY IDENTIFIED.  Imaging US Transvaginal Non-OB  Result Date: 03/21/2019 CLINICAL DATA:  Enlarged uterus on exam, anemia, menometrorrhagia EXAM: TRANSABDOMINAL AND TRANSVAGINAL ULTRASOUND OF PELVIS TECHNIQUE: Both transabdominal and transvaginal ultrasound examinations of the pelvis were performed. Transabdominal technique was performed for global imaging of the pelvis including uterus, ovaries, adnexal regions, and pelvic cul-de-sac. It was necessary to proceed with endovaginal exam following the transabdominal exam to visualize the endometrium and ovaries. COMPARISON:  None FINDINGS: Uterus Measurements: 18.9 x 13.3 x 14.9 cm = volume: 1960 mL. Large mass at posterior upper uterine segment, poorly visualized due to size and position, likely representing a large leiomyoma. This contains a few echogenic foci likely calcifications and measures 13.0 x 11.0 x 13.5 cm in size. This leiomyoma extends submucosal at the posterior aspect of the upper uterine endometrial complex. Tiny exophytic leiomyomata are seen at the anterior wall of the uterus measuring 12 mm and 8 mm in size. Endometrium On cine series, endometrial complex is demonstrated, 10 mm thick  without endometrial fluid. Right ovary Measurements: 4.2 x 2.2 x 2.9 cm = volume: 15 mL. Normal morphology  without mass Left ovary Measurements: 5.8 x 2.4 x 3.0 cm = volume: 22 mL. Normal morphology without mass Other findings Trace free pelvic fluid.  No adnexal masses. IMPRESSION: Large transmural leiomyoma at posterior upper uterus 13.5 cm in greatest size, significantly enlarging the uterus and extending submucosal. Normal thickness endometrial complex 10 mm thick without endometrial fluid or obvious mass. Normal appearing ovaries. Electronically Signed   By: Lavonia Dana M.D.   On: 03/21/2019 16:04   US Pelvis Complete  Result Date: 03/21/2019 CLINICAL DATA:  Enlarged uterus on exam, anemia, menometrorrhagia EXAM: TRANSABDOMINAL AND TRANSVAGINAL ULTRASOUND OF PELVIS TECHNIQUE: Both transabdominal and transvaginal ultrasound examinations of the pelvis were performed. Transabdominal technique was performed for global imaging of the pelvis including uterus, ovaries, adnexal regions, and pelvic cul-de-sac. It was necessary to proceed with endovaginal exam following the transabdominal exam to visualize the endometrium and ovaries. COMPARISON:  None FINDINGS: Uterus Measurements: 18.9 x 13.3 x 14.9 cm = volume: 1960 mL. Large mass at posterior upper uterine segment, poorly visualized due to size and position, likely representing a large leiomyoma. This contains a few echogenic foci likely calcifications and measures 13.0 x 11.0 x 13.5 cm in size. This leiomyoma extends submucosal at the posterior aspect of the upper uterine endometrial complex. Tiny exophytic leiomyomata are seen at the anterior wall of the uterus measuring 12 mm and 8 mm in size. Endometrium On cine series, endometrial complex is demonstrated, 10 mm thick without endometrial fluid. Right ovary Measurements: 4.2 x 2.2 x 2.9 cm = volume: 15 mL. Normal morphology without mass Left ovary Measurements: 5.8 x 2.4 x 3.0 cm = volume: 22 mL. Normal morphology without mass Other findings Trace free pelvic fluid.  No adnexal masses. IMPRESSION: Large transmural  leiomyoma at posterior upper uterus 13.5 cm in greatest size, significantly enlarging the uterus and extending submucosal. Normal thickness endometrial complex 10 mm thick without endometrial fluid or obvious mass. Normal appearing ovaries. Electronically Signed   By: Lavonia Dana M.D.   On: 03/21/2019 16:04       Assessment and Plan:      1. Uterine leiomyoma, unspecified location 2. Menorrhagia 3. Iron deficiency anemia due to chronic blood loss Patient desires definitive management with hysterectomy.  Given size of her uterus, I proposed doing a total abdominal hysterectomy (TAH) and prophylactic bilateral salpingectomy.  No indication for oophorectomy.  Patient agrees with this proposed surgery.  The risks of surgery were discussed in detail with the patient including but not limited to: bleeding which may require transfusion or reoperation; infection which may require antibiotics; injury to bowel, bladder, ureters or other surrounding organs; need for additional procedures including laparotomy; thromboembolic phenomenon, incisional problems and other postoperative/anesthesia complications.  Patient was also advised that she will remain in house for 2 nights; and expected recovery time after a hysterectomy is 6-8 weeks.  Patient was told that the likelihood that her condition and symptoms will be treated effectively with this surgical management was very high; the postoperative expectations were also discussed in detail. The patient also understands the alternative treatment options which were discussed in full. All questions were answered.  She was told that she will be contacted by our surgical scheduler regarding the time and date of her surgery; routine preoperative instructions will be given to her by the preoperative nursing team.   She is aware of need for preoperative  COVID testing and subsequent quarantine from time of test to time of surgery; she will be given further preoperative instructions  at that [place] screening visit.   Routine postoperative instructions will be reviewed with the patient in detail after surgery.  In the meantime, counseled patient about using Lysteda and Naproxen during her periods and she agreed; these were prescribed.  Bleeding precautions were reviewed. Printed patient education handouts about the procedure was given to the patient to review at home. - tranexamic acid (LYSTEDA) 650 MG TABS tablet; Take 2 tablets (1,300 mg total) by mouth 3 (three) times daily. Take during menses for a maximum of five days  Dispense: 30 tablet; Refill: 5 - naproxen (NAPROSYN) 500 MG tablet; Take 1 tablet (500 mg total) by mouth 2 (two) times daily with a meal. As needed for pain or heavy bleeding.  Dispense: 60 tablet; Refill: 2    Please refer to After Visit Summary for other counseling recommendations.   Return in about 3 months (around 07/28/2019) for Presurgical Appointments.    Total face-to-face time with patient: 30 minutes.  Over 50% of encounter was spent on counseling and coordination of care.   Verita Schneiders, MD, [facility] [employer], [place]

## 2019-05-04 ENCOUNTER — Telehealth: Payer: Self-pay | Admitting: Osteopathic Medicine

## 2019-05-04 DIAGNOSIS — Z008 Encounter for other general examination: Secondary | ICD-10-CM

## 2019-05-04 NOTE — Telephone Encounter (Signed)
Please call patient: I have received a wellness screening form to complete for her, however we do not have records that include labs with fasting blood sugar, cholesterol levels.  These are not levels that are typically necessary for anemia work-up, so I do not think these were ever done.   I have placed orders for these, if she needs to get these done she can come to the lab at her convenience.  If she has records at home, she needs to let us know.

## 2019-05-04 NOTE — Telephone Encounter (Signed)
Called patient and LM on VM to return call. KG LPN 

## 2019-05-08 NOTE — Telephone Encounter (Signed)
Tried to call patient again, straight to VM. Sent pt a Comptroller

## 2019-05-11 ENCOUNTER — Encounter: Payer: Self-pay | Admitting: Family

## 2019-05-11 ENCOUNTER — Inpatient Hospital Stay (HOSPITAL_BASED_OUTPATIENT_CLINIC_OR_DEPARTMENT_OTHER): Payer: Managed Care, Other (non HMO) | Admitting: Family

## 2019-05-11 ENCOUNTER — Inpatient Hospital Stay: Payer: Managed Care, Other (non HMO)

## 2019-05-11 ENCOUNTER — Inpatient Hospital Stay: Payer: Managed Care, Other (non HMO) | Attending: Family

## 2019-05-11 ENCOUNTER — Other Ambulatory Visit: Payer: Self-pay

## 2019-05-11 VITALS — BP 130/94 | HR 68

## 2019-05-11 VITALS — BP 132/74 | HR 72 | Temp 97.7°F | Resp 18 | Ht 64.0 in | Wt 187.8 lb

## 2019-05-11 DIAGNOSIS — N920 Excessive and frequent menstruation with regular cycle: Secondary | ICD-10-CM | POA: Insufficient documentation

## 2019-05-11 DIAGNOSIS — D509 Iron deficiency anemia, unspecified: Secondary | ICD-10-CM

## 2019-05-11 DIAGNOSIS — N921 Excessive and frequent menstruation with irregular cycle: Secondary | ICD-10-CM

## 2019-05-11 DIAGNOSIS — D500 Iron deficiency anemia secondary to blood loss (chronic): Secondary | ICD-10-CM | POA: Diagnosis not present

## 2019-05-11 LAB — CBC WITH DIFFERENTIAL (CANCER CENTER ONLY)
Abs Immature Granulocytes: 0.01 10*3/uL (ref 0.00–0.07)
Basophils Absolute: 0 10*3/uL (ref 0.0–0.1)
Basophils Relative: 2 %
Eosinophils Absolute: 0.1 10*3/uL (ref 0.0–0.5)
Eosinophils Relative: 3 %
HCT: 34.8 % — ABNORMAL LOW (ref 36.0–46.0)
Hemoglobin: 9.4 g/dL — ABNORMAL LOW (ref 12.0–15.0)
Immature Granulocytes: 0 %
Lymphocytes Relative: 48 %
Lymphs Abs: 1.2 10*3/uL (ref 0.7–4.0)
MCH: 19 pg — ABNORMAL LOW (ref 26.0–34.0)
MCHC: 27 g/dL — ABNORMAL LOW (ref 30.0–36.0)
MCV: 70.2 fL — ABNORMAL LOW (ref 80.0–100.0)
Monocytes Absolute: 0.4 10*3/uL (ref 0.1–1.0)
Monocytes Relative: 17 %
Neutro Abs: 0.8 10*3/uL — ABNORMAL LOW (ref 1.7–7.7)
Neutrophils Relative %: 30 %
Platelet Count: 342 10*3/uL (ref 150–400)
RBC: 4.96 MIL/uL (ref 3.87–5.11)
RDW: 27.5 % — ABNORMAL HIGH (ref 11.5–15.5)
WBC Count: 2.5 10*3/uL — ABNORMAL LOW (ref 4.0–10.5)
nRBC: 0 % (ref 0.0–0.2)

## 2019-05-11 LAB — IRON AND TIBC
Iron: 34 ug/dL — ABNORMAL LOW (ref 41–142)
Saturation Ratios: 10 % — ABNORMAL LOW (ref 21–57)
TIBC: 359 ug/dL (ref 236–444)
UIBC: 324 ug/dL (ref 120–384)

## 2019-05-11 LAB — FERRITIN: Ferritin: 21 ng/mL (ref 11–307)

## 2019-05-11 MED ORDER — SODIUM CHLORIDE 0.9 % IV SOLN
200.0000 mg | Freq: Once | INTRAVENOUS | Status: AC
  Administered 2019-05-11: 200 mg via INTRAVENOUS
  Filled 2019-05-11: qty 200

## 2019-05-11 MED ORDER — SODIUM CHLORIDE 0.9 % IV SOLN
Freq: Once | INTRAVENOUS | Status: AC
  Filled 2019-05-11: qty 250

## 2019-05-11 NOTE — Progress Notes (Signed)
Hematology and Oncology Follow Up Visit  Grace Robertson [id number] May 27, 1980 39 y.o. 37 y.o. 05/11/2019   Principle Diagnosis:  Iron deficiency anemia secondary to heavy cycle  Current Therapy:   IV iron as indicated    Interim History:  Grace Robertson is here today for follow-up and her 5th Venofer infusion. She is doing well but still noting fatigue.  TV US showed a 13.5 cm transmural leiomyoma in the upper uterus. She plans to have a partial hysterectomy this summer while out of school.  Her cycle remains heavy but she has Lysteda to start with her next cycle to help control the bleeding.  No other bleeding noted. No bruising or petechiae.  No fever, chills, n/v, cough, rash, dizziness, SOB, chest pain, palpitations, abdominal pain or changes in bowel or bladder habits.  No swelling, tenderness, numbness or tingling in her extremities.  No falls or syncope.  She has maintained a good appetite and is staying well hydrated. Her weight is stable.   ECOG Performance Status: 1 - Symptomatic but completely ambulatory  Medications:  Allergies as of 05/11/2019   No Known Allergies     Medication List       Accurate as of May 11, 2019  9:12 AM. If you have any questions, ask your nurse or doctor.        naproxen 500 MG tablet Commonly known as: NAPROSYN Take 1 tablet (500 mg total) by mouth 2 (two) times daily with a meal. As needed for pain   tranexamic acid 650 MG Tabs tablet Commonly known as: LYSTEDA Take 2 tablets (1,300 mg total) by mouth 3 (three) times daily. Take during menses for a maximum of five days       Allergies: No Known Allergies  Past Medical History, Surgical history, Social history, and Family History were reviewed and updated.  Review of Systems: All other 10 point review of systems is negative.   Physical Exam:  height is 5\' 4"  (1.626 m) and weight is 187 lb 12.8 oz (85.2 kg). Her temporal temperature is 97.7 F (36.5 C). Her blood pressure is 132/74  and her pulse is 72. Her respiration is 18 and oxygen saturation is 100%.   Wt Readings from Last 3 Encounters:  05/11/19 187 lb 12.8 oz (85.2 kg)  04/30/19 183 lb (83 kg)  04/09/19 183 lb (83 kg)    Ocular: Sclerae unicteric, pupils equal, round and reactive to light Ear-nose-throat: Oropharynx clear, dentition fair Lymphatic: No cervical or supraclavicular adenopathy Lungs no rales or rhonchi, good excursion bilaterally Heart regular rate and rhythm, no murmur appreciated Abd soft, nontender, positive bowel sounds, no liver or spleen tip palpated on exam, no fluid wave  MSK no focal spinal tenderness, no joint edema Neuro: non-focal, well-oriented, appropriate affect Breasts: Deferred   Lab Results  Component Value Date   WBC 2.5 (L) 05/11/2019   HGB 9.4 (L) 05/11/2019   HCT 34.8 (L) 05/11/2019   MCV 70.2 (L) 05/11/2019   PLT 342 05/11/2019   Lab Results  Component Value Date   FERRITIN 2 (L) 03/05/2019   FERRITIN 3 (L) 03/05/2019   IRON 10 (L) 03/05/2019   TIBC 414 03/05/2019   IRONPCTSAT 2 (L) 03/05/2019   Lab Results  Component Value Date   RETICCTPCT 0.9 03/14/2019   RBC 4.96 05/11/2019   No results found for: KPAFRELGTCHN, LAMBDASER, KAPLAMBRATIO No results found for: IGGSERUM, IGA, IGMSERUM No results found for: TOTALPROTELP, ALBUMINELP, A1GS, A2GS, BETS, BETA2SER, [place], MSPIKE, SPEI  Chemistry   No results found for: NA, K, CL, CO2, BUN, CREATININE, GLU No results found for: CALCIUM, ALKPHOS, AST, ALT, BILITOT     Impression and Plan: Grace Robertson is a very pleasant 39 yo African American female with history of iron deficiency anemia secondary to heavy cycles.  TV US revealed a 13.5 cm transmural leiomyoma in the upper uterus and she plans to have a partial hysterectomy this summer.  We will see what her iron studies look like and replace if needed.  She is scheduled to have her 5th Venofer infusion today.  We will go ahead and plan to see her back in 8  weeks. She will contact our office with any questions or concerns. We can certainly see her sooner if needed.   Laverna Peace, NP 3/5/20219:12 AM

## 2019-05-11 NOTE — Patient Instructions (Signed)

## 2019-05-16 NOTE — Telephone Encounter (Signed)
Patient reviewed MyChart results on 05/11/19.

## 2019-05-18 ENCOUNTER — Ambulatory Visit: Payer: Managed Care, Other (non HMO)

## 2019-05-22 ENCOUNTER — Telehealth: Payer: Self-pay | Admitting: Family

## 2019-05-22 NOTE — Telephone Encounter (Signed)
Called LMVM regarding appointment date/time change due to infusion schedule being OVERBOOKED.  I asked that she call back to confirm this change

## 2019-05-23 ENCOUNTER — Inpatient Hospital Stay: Payer: Managed Care, Other (non HMO)

## 2019-05-25 ENCOUNTER — Ambulatory Visit: Payer: Managed Care, Other (non HMO)

## 2019-05-30 ENCOUNTER — Telehealth: Payer: Self-pay | Admitting: *Deleted

## 2019-05-30 NOTE — Telephone Encounter (Signed)
Left patient a message to call and schedule presurg appointment with Dr. Harolyn Rutherford for 07/23/19 or 08/02/19.

## 2019-06-04 ENCOUNTER — Inpatient Hospital Stay: Payer: Managed Care, Other (non HMO)

## 2019-06-04 ENCOUNTER — Other Ambulatory Visit: Payer: Self-pay

## 2019-06-04 VITALS — BP 137/93 | HR 74 | Temp 97.7°F | Resp 17

## 2019-06-04 DIAGNOSIS — D509 Iron deficiency anemia, unspecified: Secondary | ICD-10-CM

## 2019-06-04 DIAGNOSIS — D500 Iron deficiency anemia secondary to blood loss (chronic): Secondary | ICD-10-CM | POA: Diagnosis not present

## 2019-06-04 MED ORDER — SODIUM CHLORIDE 0.9 % IV SOLN
200.0000 mg | Freq: Once | INTRAVENOUS | Status: AC
  Administered 2019-06-04: 13:00:00 200 mg via INTRAVENOUS
  Filled 2019-06-04: qty 200

## 2019-06-04 NOTE — Patient Instructions (Signed)

## 2019-06-20 ENCOUNTER — Encounter (INDEPENDENT_AMBULATORY_CARE_PROVIDER_SITE_OTHER): Payer: Self-pay

## 2019-07-04 ENCOUNTER — Telehealth: Payer: Self-pay | Admitting: *Deleted

## 2019-07-04 NOTE — Telephone Encounter (Signed)
Left patient a message to call and schedule appointment with Dr. Harolyn Rutherford.

## 2019-07-06 ENCOUNTER — Other Ambulatory Visit: Payer: Self-pay | Admitting: Family

## 2019-07-06 ENCOUNTER — Encounter: Payer: Self-pay | Admitting: Family

## 2019-07-06 ENCOUNTER — Inpatient Hospital Stay: Payer: Managed Care, Other (non HMO)

## 2019-07-06 ENCOUNTER — Other Ambulatory Visit: Payer: Self-pay

## 2019-07-06 ENCOUNTER — Inpatient Hospital Stay: Payer: Managed Care, Other (non HMO) | Attending: Family | Admitting: Family

## 2019-07-06 VITALS — BP 130/84 | HR 79 | Temp 97.3°F | Resp 18 | Ht 64.0 in | Wt 180.0 lb

## 2019-07-06 DIAGNOSIS — D509 Iron deficiency anemia, unspecified: Secondary | ICD-10-CM | POA: Diagnosis not present

## 2019-07-06 DIAGNOSIS — D500 Iron deficiency anemia secondary to blood loss (chronic): Secondary | ICD-10-CM | POA: Diagnosis not present

## 2019-07-06 DIAGNOSIS — D560 Alpha thalassemia: Secondary | ICD-10-CM

## 2019-07-06 DIAGNOSIS — N920 Excessive and frequent menstruation with regular cycle: Secondary | ICD-10-CM | POA: Insufficient documentation

## 2019-07-06 DIAGNOSIS — N921 Excessive and frequent menstruation with irregular cycle: Secondary | ICD-10-CM

## 2019-07-06 LAB — CBC WITH DIFFERENTIAL (CANCER CENTER ONLY)
Abs Immature Granulocytes: 0.01 10*3/uL (ref 0.00–0.07)
Basophils Absolute: 0 10*3/uL (ref 0.0–0.1)
Basophils Relative: 1 %
Eosinophils Absolute: 0.1 10*3/uL (ref 0.0–0.5)
Eosinophils Relative: 4 %
HCT: 38.9 % (ref 36.0–46.0)
Hemoglobin: 11.3 g/dL — ABNORMAL LOW (ref 12.0–15.0)
Immature Granulocytes: 0 %
Lymphocytes Relative: 49 %
Lymphs Abs: 1.2 10*3/uL (ref 0.7–4.0)
MCH: 21.6 pg — ABNORMAL LOW (ref 26.0–34.0)
MCHC: 29 g/dL — ABNORMAL LOW (ref 30.0–36.0)
MCV: 74.2 fL — ABNORMAL LOW (ref 80.0–100.0)
Monocytes Absolute: 0.4 10*3/uL (ref 0.1–1.0)
Monocytes Relative: 14 %
Neutro Abs: 0.8 10*3/uL — ABNORMAL LOW (ref 1.7–7.7)
Neutrophils Relative %: 32 %
Platelet Count: 281 10*3/uL (ref 150–400)
RBC: 5.24 MIL/uL — ABNORMAL HIGH (ref 3.87–5.11)
RDW: 22.5 % — ABNORMAL HIGH (ref 11.5–15.5)
WBC Count: 2.5 10*3/uL — ABNORMAL LOW (ref 4.0–10.5)
nRBC: 0 % (ref 0.0–0.2)

## 2019-07-06 LAB — FERRITIN: Ferritin: 9 ng/mL — ABNORMAL LOW (ref 11–307)

## 2019-07-06 LAB — IRON AND TIBC
Iron: 45 ug/dL (ref 41–142)
Saturation Ratios: 13 % — ABNORMAL LOW (ref 21–57)
TIBC: 355 ug/dL (ref 236–444)
UIBC: 310 ug/dL (ref 120–384)

## 2019-07-06 LAB — RETICULOCYTES
Immature Retic Fract: 9.6 % (ref 2.3–15.9)
RBC.: 5.2 MIL/uL — ABNORMAL HIGH (ref 3.87–5.11)
Retic Count, Absolute: 23.4 10*3/uL (ref 19.0–186.0)
Retic Ct Pct: 0.5 % (ref 0.4–3.1)

## 2019-07-06 MED ORDER — FOLIC ACID 1 MG PO TABS: 1.0000 mg | ORAL_TABLET | Freq: Every day | ORAL | 11 refills | Status: DC

## 2019-07-06 NOTE — Progress Notes (Signed)
Hematology and Oncology Follow Up Visit  AHLEENA SONN [id number] [birth year] 39 y.o. 07/06/2019   Principle Diagnosis:  Iron deficiency anemia secondary to heavy cycle Alpha Thalassemia   Current Therapy:        IV iron as indicated    Interim History:  Ms. Kult is here today for follow-up. She is doing well but still has some fatigue.  She is scheduled for hysterectomy on 6/29. Hgb today is stable at 11.3, MCV 74.  No new blood loss. No bruising or petechiae.  No fever, chills, n/v, cough, rash, dizziness, SOB, chest pain, palpitations, abdominal pain or changes in bowel or bladder habits.  No swelling, tenderness, numbness or tingling in her extremities.  No falls or syncope.  She has maintained a good appetite and is staying well hydrated. Her weight is stable.   ECOG Performance Status: 1 - Symptomatic but completely ambulatory  Medications:  Allergies as of 07/06/2019   No Known Allergies     Medication List       Accurate as of July 06, 2019  9:01 AM. If you have any questions, ask your nurse or doctor.        naproxen 500 MG tablet Commonly known as: NAPROSYN Take 1 tablet (500 mg total) by mouth 2 (two) times daily with a meal. As needed for pain   tranexamic acid 650 MG Tabs tablet Commonly known as: LYSTEDA Take 2 tablets (1,300 mg total) by mouth 3 (three) times daily. Take during menses for a maximum of five days       Allergies: No Known Allergies  Past Medical History, Surgical history, Social history, and Family History were reviewed and updated.  Review of Systems: All other 10 point review of systems is negative.   Physical Exam:  height is 5\' 4"  (1.626 m) and weight is 180 lb (81.6 kg). Her temporal temperature is 97.3 F (36.3 C) (abnormal). Her blood pressure is 130/84 and her pulse is 79. Her respiration is 18 and oxygen saturation is 100%.   Wt Readings from Last 3 Encounters:  07/06/19 180 lb (81.6 kg)  05/11/19 187 lb 12.8 oz  (85.2 kg)  04/30/19 183 lb (83 kg)    Ocular: Sclerae unicteric, pupils equal, round and reactive to light Ear-nose-throat: Oropharynx clear, dentition fair Lymphatic: No cervical or supraclavicular adenopathy Lungs no rales or rhonchi, good excursion bilaterally Heart regular rate and rhythm, no murmur appreciated Abd soft, nontender, positive bowel sounds, no liver or spleen tip palpated on exam, no fluid wave  MSK no focal spinal tenderness, no joint edema Neuro: non-focal, well-oriented, appropriate affect Breasts: Deferred   Lab Results  Component Value Date   WBC 2.5 (L) 07/06/2019   HGB 11.3 (L) 07/06/2019   HCT 38.9 07/06/2019   MCV 74.2 (L) 07/06/2019   PLT 281 07/06/2019   Lab Results  Component Value Date   FERRITIN 21 05/11/2019   IRON 34 (L) 05/11/2019   TIBC 359 05/11/2019   UIBC 324 05/11/2019   IRONPCTSAT 10 (L) 05/11/2019   Lab Results  Component Value Date   RETICCTPCT 0.5 07/06/2019   RBC 5.20 (H) 07/06/2019   No results found for: KPAFRELGTCHN, LAMBDASER, KAPLAMBRATIO No results found for: IGGSERUM, IGA, IGMSERUM No results found for: TOTALPROTELP, ALBUMINELP, A1GS, A2GS, BETS, BETA2SER, [place], MSPIKE, SPEI   Chemistry   No results found for: NA, K, CL, CO2, BUN, CREATININE, GLU No results found for: CALCIUM, ALKPHOS, AST, ALT, BILITOT     Impression and  Plan: Ms. Naval is a very pleasant 39 yo [place] American female with history of iron deficiency anemia secondary to heavy cycles.  She is scheduled for hysterectomy on 6/29.  We will see what her iron studies look like and replace if needed.  We will go ahead and plan to see her back in 10 weeks.  She will contact our office with any questions or concerns. We can certainly see her sooner if needed.   Laverna Peace, NP 4/30/20219:01 AM

## 2019-07-13 ENCOUNTER — Ambulatory Visit: Payer: Managed Care, Other (non HMO)

## 2019-07-16 ENCOUNTER — Ambulatory Visit: Payer: Managed Care, Other (non HMO)

## 2019-07-20 ENCOUNTER — Ambulatory Visit: Payer: Managed Care, Other (non HMO)

## 2019-07-20 ENCOUNTER — Other Ambulatory Visit: Payer: Self-pay

## 2019-07-20 ENCOUNTER — Inpatient Hospital Stay: Payer: Managed Care, Other (non HMO) | Attending: Family

## 2019-07-20 VITALS — BP 132/96 | HR 86 | Temp 97.8°F | Resp 17

## 2019-07-20 DIAGNOSIS — D500 Iron deficiency anemia secondary to blood loss (chronic): Secondary | ICD-10-CM | POA: Insufficient documentation

## 2019-07-20 DIAGNOSIS — N920 Excessive and frequent menstruation with regular cycle: Secondary | ICD-10-CM | POA: Insufficient documentation

## 2019-07-20 DIAGNOSIS — D509 Iron deficiency anemia, unspecified: Secondary | ICD-10-CM

## 2019-07-20 MED ORDER — SODIUM CHLORIDE 0.9 % IV SOLN
200.0000 mg | Freq: Once | INTRAVENOUS | Status: AC
  Administered 2019-07-20: 200 mg via INTRAVENOUS
  Filled 2019-07-20: qty 200

## 2019-07-20 NOTE — Progress Notes (Signed)
Pt declined to stay for 30 minute post infusion observation period. Pt stated she has tolerated this medication prior without difficulty. Pt left prior to D/C vitals. Pt aware to call clinic with any questions or concerns. Pt verbalized understanding and had no further questions. Pt left clinic ambulatory in no distress.

## 2019-07-20 NOTE — Patient Instructions (Signed)

## 2019-07-23 ENCOUNTER — Ambulatory Visit: Payer: Managed Care, Other (non HMO)

## 2019-07-23 ENCOUNTER — Telehealth: Payer: Managed Care, Other (non HMO) | Admitting: Obstetrics & Gynecology

## 2019-07-27 ENCOUNTER — Ambulatory Visit: Payer: Managed Care, Other (non HMO)

## 2019-08-17 ENCOUNTER — Inpatient Hospital Stay: Payer: Managed Care, Other (non HMO) | Attending: Family

## 2019-08-17 ENCOUNTER — Other Ambulatory Visit: Payer: Self-pay

## 2019-08-17 VITALS — BP 133/94 | HR 73 | Temp 97.5°F | Resp 18

## 2019-08-17 DIAGNOSIS — D500 Iron deficiency anemia secondary to blood loss (chronic): Secondary | ICD-10-CM | POA: Diagnosis not present

## 2019-08-17 DIAGNOSIS — N920 Excessive and frequent menstruation with regular cycle: Secondary | ICD-10-CM | POA: Insufficient documentation

## 2019-08-17 DIAGNOSIS — D509 Iron deficiency anemia, unspecified: Secondary | ICD-10-CM

## 2019-08-17 MED ORDER — SODIUM CHLORIDE 0.9 % IV SOLN
200.0000 mg | Freq: Once | INTRAVENOUS | Status: AC
  Administered 2019-08-17: 200 mg via INTRAVENOUS
  Filled 2019-08-17: qty 200

## 2019-08-17 NOTE — Patient Instructions (Signed)

## 2019-08-24 ENCOUNTER — Inpatient Hospital Stay: Payer: Managed Care, Other (non HMO)

## 2019-08-24 ENCOUNTER — Other Ambulatory Visit: Payer: Self-pay

## 2019-08-24 VITALS — BP 125/92 | HR 70 | Temp 97.0°F | Resp 19

## 2019-08-24 DIAGNOSIS — D500 Iron deficiency anemia secondary to blood loss (chronic): Secondary | ICD-10-CM | POA: Diagnosis not present

## 2019-08-24 DIAGNOSIS — D509 Iron deficiency anemia, unspecified: Secondary | ICD-10-CM

## 2019-08-24 MED ORDER — SODIUM CHLORIDE 0.9 % IV SOLN
Freq: Once | INTRAVENOUS | Status: AC
  Filled 2019-08-24: qty 250

## 2019-08-24 MED ORDER — SODIUM CHLORIDE 0.9 % IV SOLN
200.0000 mg | Freq: Once | INTRAVENOUS | Status: AC
  Administered 2019-08-24: 200 mg via INTRAVENOUS
  Filled 2019-08-24: qty 200

## 2019-08-24 NOTE — Patient Instructions (Signed)

## 2019-08-24 NOTE — Progress Notes (Signed)
Patient did not want to wait for post iron observation period.  No signs symptoms of reaction noted

## 2019-08-27 ENCOUNTER — Inpatient Hospital Stay: Payer: Managed Care, Other (non HMO)

## 2019-08-27 ENCOUNTER — Other Ambulatory Visit: Payer: Self-pay

## 2019-08-27 VITALS — BP 123/96 | HR 70 | Temp 97.5°F | Resp 17

## 2019-08-27 DIAGNOSIS — D500 Iron deficiency anemia secondary to blood loss (chronic): Secondary | ICD-10-CM | POA: Diagnosis not present

## 2019-08-27 DIAGNOSIS — D509 Iron deficiency anemia, unspecified: Secondary | ICD-10-CM

## 2019-08-27 MED ORDER — SODIUM CHLORIDE 0.9 % IV SOLN
200.0000 mg | Freq: Once | INTRAVENOUS | Status: AC
  Administered 2019-08-27: 200 mg via INTRAVENOUS
  Filled 2019-08-27: qty 200

## 2019-08-27 NOTE — Patient Instructions (Signed)

## 2019-08-27 NOTE — Progress Notes (Signed)
Pt declined to stay for post infusion observation period. Pt stated she has tolerated this medication prior without difficulty. Pt aware to call clinic with any questions or concerns. Pt verbalized understanding and had no further questions.

## 2019-08-30 ENCOUNTER — Encounter: Payer: Self-pay | Admitting: Obstetrics & Gynecology

## 2019-08-30 ENCOUNTER — Other Ambulatory Visit: Payer: Self-pay

## 2019-08-30 ENCOUNTER — Ambulatory Visit (INDEPENDENT_AMBULATORY_CARE_PROVIDER_SITE_OTHER): Payer: Managed Care, Other (non HMO) | Admitting: Obstetrics & Gynecology

## 2019-08-30 ENCOUNTER — Inpatient Hospital Stay: Payer: Managed Care, Other (non HMO)

## 2019-08-30 VITALS — BP 149/96 | HR 81 | Resp 16 | Ht 64.0 in | Wt 190.0 lb

## 2019-08-30 VITALS — BP 129/97 | HR 69 | Temp 98.2°F | Resp 17

## 2019-08-30 DIAGNOSIS — D509 Iron deficiency anemia, unspecified: Secondary | ICD-10-CM

## 2019-08-30 DIAGNOSIS — D259 Leiomyoma of uterus, unspecified: Secondary | ICD-10-CM

## 2019-08-30 DIAGNOSIS — Z01818 Encounter for other preprocedural examination: Secondary | ICD-10-CM

## 2019-08-30 DIAGNOSIS — N921 Excessive and frequent menstruation with irregular cycle: Secondary | ICD-10-CM

## 2019-08-30 DIAGNOSIS — D500 Iron deficiency anemia secondary to blood loss (chronic): Secondary | ICD-10-CM | POA: Diagnosis not present

## 2019-08-30 MED ORDER — SODIUM CHLORIDE 0.9 % IV SOLN
INTRAVENOUS | Status: DC
  Filled 2019-08-30: qty 250

## 2019-08-30 MED ORDER — SODIUM CHLORIDE 0.9 % IV SOLN
200.0000 mg | Freq: Once | INTRAVENOUS | Status: AC
  Administered 2019-08-30: 200 mg via INTRAVENOUS
  Filled 2019-08-30: qty 200

## 2019-08-30 NOTE — Patient Instructions (Signed)
Abdominal Hysterectomy Abdominal hysterectomy is a surgical procedure to remove the womb (uterus). The uterus is the muscular organ that houses a developing baby. This surgery may be done if:  You have cancer.  You have growths (tumors or fibroids) in the uterus.  You have long-term (chronic) pain.  You are bleeding.  Your uterus has slipped down into your vagina (uterine prolapse).  You have a condition in which the tissue that lines the uterus grows outside of its normal location (endometriosis).  You have an infection in your uterus.  You are having problems with your menstrual cycle. Depending on why you are having this procedure, you may also have other reproductive organs removed. These could include:  The part of your vagina that connects with your uterus (cervix).  The organs that make eggs (ovaries).  The tubes that connect the ovaries to the uterus (fallopian tubes). Tell a health care provider about:  Any allergies you have.  All medicines you are taking, including vitamins, herbs, eye drops, creams, and over-the-counter medicines.  Any problems you or family members have had with anesthetic medicines.  Any blood disorders you have.  Any surgeries you have had.  Any medical conditions you have.  Whether you are pregnant or may be pregnant. What are the risks? Generally, this is a safe procedure. However, problems may occur, including:  Bleeding.  Infection.  Allergic reactions to medicines or dyes.  Damage to other structures or organs.  Nerve injury.  Decreased interest in sex or pain during sex.  Blood clots that can break free and travel to your lungs. What happens before the procedure? Staying hydrated Follow instructions from your health care provider about hydration, which may include:  Up to 2 hours before the procedure - you may continue to drink clear liquids, such as water, clear fruit juice, black coffee, and plain tea Eating and  drinking restrictions Follow instructions from your health care provider about eating and drinking, which may include:  8 hours before the procedure - stop eating heavy meals or foods such as meat, fried foods, or fatty foods.  6 hours before the procedure - stop eating light meals or foods, such as toast or cereal.  6 hours before the procedure - stop drinking milk or drinks that contain milk.  2 hours before the procedure - stop drinking clear liquids. Medicines  Ask your health care provider about: ? Changing or stopping your regular medicines. This is especially important if you are taking diabetes medicines or blood thinners. ? Taking medicines such as aspirin and ibuprofen. These medicines can thin your blood. Do not take these medicines before your procedure if your health care provider instructs you not to.  You may be given antibiotic medicine to help prevent infection. Take it as told by your health care provider.  You may be asked to take laxatives to prevent constipation. General instructions  Ask your health care provider how your surgical site will be marked or identified.  You may be asked to shower with a germ-killing soap.  Plan to have someone take you home from the hospital.  Do not use any products that contain nicotine or tobacco, such as cigarettes and e-cigarettes. If you need help quitting, ask your health care provider.  You may have an exam or testing.  You may have a blood or urine sample taken.  You may need to have an enema to clean out your rectum and lower colon.  This procedure can affect the way   you feel about yourself. Talk to your health care provider about the physical and emotional changes this procedure may cause. What happens during the procedure?  To lower your risk of infection: ? Your health care team will wash or sanitize their hands. ? Your skin will be washed with soap. ? Hair may be removed from the surgical area.  An IV tube  will be inserted into one of your veins.  You will be given one or more of the following: ? A medicine to help you relax (sedative). ? A medicine to make you fall asleep (general anesthetic).  Tight-fitting (compression) stockings will be placed on your legs to promote circulation.  A thin, flexible tube (catheter) will be inserted to help drain your urine.  The surgeon will make a cut (incision) through the skin in your lower belly. The incision may go side-to-side or up-and-down.  The surgeon will move aside the body tissue that covers your uterus. The surgeon will then carefully take out your uterus along with any of the other organs that need to be removed.  Bleeding will be controlled with clamps or sutures.  The surgeon will close your incision with stitches (sutures), skin glue, or adhesive strips.  A bandage (dressing) will be placed over the incision. The procedure may vary among health care providers and hospitals. What happens after the procedure?  You will be given pain medicine as needed.  Your blood pressure, heart rate, breathing rate, and blood oxygen level will be monitored until the medicines you were given have worn off.  You will need to stay in the hospital to recover for one to two days. Ask your health care provider how long you will need to stay in the hospital after your procedure.  You may have a liquid diet at first. You will most likely return to your usual diet the day after surgery.  You will still have the urinary catheter in place. It will likely be removed the day after surgery.  You may have to wear compression stockings. These stockings help to prevent blood clots and reduce swelling in your legs.  You will be encouraged to walk as soon as possible. You will also use a device or do breathing exercises to keep your lungs clear.  You may need to use a sanitary napkin for vaginal discharge. Summary  Abdominal hysterectomy is a surgical procedure  to remove the womb (uterus). The uterus is the muscular organ that houses a developing baby.  This procedure can affect the way you feel about yourself. Talk to your health care provider about the physical and emotional changes this procedure may cause.  You will be given medicines for pain after the procedure.  You will need to stay in the hospital to recover. Ask your health care provider how long you will need to stay in the hospital after your procedure. This information is not intended to replace advice given to you by your health care provider. Make sure you discuss any questions you have with your health care provider. Document Revised: 03/29/2018 Document Reviewed: 02/11/2016 Elsevier Patient Education  [place].    Abdominal Hysterectomy, Care After This sheet gives you information about how to care for yourself after your procedure. Your health care provider may also give you more specific instructions. If you have problems or questions, contact your health care provider. What can I expect after the procedure? After your procedure, it is common to have:  Pain.  Fatigue.  Poor appetite.  Less interest in sex.  Vaginal bleeding and discharge. You may need to use a sanitary napkin after this procedure. Follow these instructions at home: Bathing  Do not take baths, swim, or use a hot tub until your health care provider approves. Ask your health care provider if you can take showers. You may only be allowed to take sponge baths for bathing.  Keep the bandage (dressing) dry until your health care provider says it can be removed. Incision care   Follow instructions from your health care provider about how to take care of your incision. Make sure you: ? Wash your hands with soap and water before you change your bandage (dressing). If soap and water are not available, use hand sanitizer. ? Change your dressing as told by your health care provider. ? Leave stitches  (sutures), skin glue, or adhesive strips in place. These skin closures may need to stay in place for 2 weeks or longer. If adhesive strip edges start to loosen and curl up, you may trim the loose edges. Do not remove adhesive strips completely unless your health care provider tells you to do that.  Check your incision area every day for signs of infection. Check for: ? Redness, swelling, or pain. ? Fluid or blood. ? Warmth. ? Pus or a bad smell. Activity  Do gentle, daily exercises as told by your health care provider. You may be told to take short walks every day and go farther each time.  Do not lift anything that is heavier than 10 lb (4.5 kg), or the limit that your health care provider tells you, until he or she says that it is safe.  Do not drive or use heavy machinery while taking prescription pain medicine.  Do not drive for 24 hours if you were given a medicine to help you relax (sedative).  Follow your health care provider's instructions about exercise, driving, and general activities. Ask your health care provider what activities are safe for you. Lifestyle  Do not douche, use tampons, or have sex for at least 8 weeks or as told by your health care provider.  Do not drink alcohol until your health care provider approves.  Drink enough fluid to keep your urine clear or pale yellow.  Try to have someone at home with you for the first 1-2 weeks to help.  Do not use any products that contain nicotine or tobacco, such as cigarettes and e-cigarettes. These can delay healing. If you need help quitting, ask your health care provider. General instructions  Take over-the-counter and prescription medicines only as told by your health care provider.  Do not take aspirin or ibuprofen. These medicines can cause bleeding.  To prevent or treat constipation while you are taking prescription pain medicine, your health care provider may recommend that you: ? Drink enough fluid to keep  your urine clear or pale yellow. ? Take over-the-counter or prescription medicines. ? Eat foods that are high in fiber, such as fresh fruits and vegetables, whole grains, and beans. ? Limit foods that are high in fat and processed sugars, such as fried and sweet foods.  Keep all follow-up visits as told by your health care provider. This is important. Contact a health care provider if:  You have chills or fever.  You have redness, swelling, or pain around your incision.  You have fluid or blood coming from your incision.  Your incision feels warm to the touch.  You have pus or a bad smell coming from  your incision.  Your incision breaks open.  You feel dizzy or light-headed.  You have pain or bleeding when you urinate.  You have persistent diarrhea.  You have persistent nausea and vomiting.  You have abnormal vaginal discharge.  You have a rash.  You have any type of abnormal reaction or you develop an allergy to your medicine.  Your pain medicine does not help. Get help right away if:  You have a fever and your symptoms suddenly get worse.  You have severe abdominal pain.  You have shortness of breath.  You faint.  You have pain, swelling, or redness in your leg.  You have heavy vaginal bleeding with blood clots. Summary  After your procedure, it is common to have pain, fatigue and vaginal discharge.  Do not take baths, swim, or use a hot tub until your health care provider approves. Ask your health care provider if you can take showers. You may only be allowed to take sponge baths for bathing.  Follow your health care provider's instructions about exercise, driving, and general activities. Ask your health care provider what activities are safe for you.  Do not lift anything that is heavier than 10 lb (4.5 kg), or the limit that your health care provider tells you, until he or she says that it is safe.  Try to have someone at home with you for the first 1-2  weeks to help. This information is not intended to replace advice given to you by your health care provider. Make sure you discuss any questions you have with your health care provider. Document Revised: 03/28/2018 Document Reviewed: 02/11/2016 Elsevier Patient Education  [place].

## 2019-08-30 NOTE — Progress Notes (Signed)
[id number] - [place], [place] - [address] AT [address] [place] [address] Phone: [phone] Fax: [phone]      Your procedure is scheduled on Tuesday, June 29th.  Report to [facility] "[facility]" at 12:45 P.M., and check in at the Admitting office.  Call this number if you have problems the morning of surgery:  [phone]  Call [phone] if you have any questions prior to your surgery date Monday-Friday 8am-4pm    Remember:  Do not eat after midnight the night before your surgery  You may drink clear liquids until 11:45 AM the morning of your surgery.   Clear liquids allowed are: Water, Non-Citrus Juices (without pulp), Carbonated Beverages, Clear Tea, Black Coffee Only, and Gatorade    Take these medicines the morning of surgery with A SIP OF WATER   NONE  As of today, STOP taking any Aspirin (unless otherwise instructed by your surgeon) and Aspirin containing products, Aleve, Naproxen, Ibuprofen, Motrin, Advil, Goody's, BC's, all herbal medications, fish oil, and all vitamins.                      Do not wear jewelry, make up, or nail polish            Do not wear lotions, powders, perfumes, or deodorant.            Do not shave 48 hours prior to surgery.              Do not bring valuables to the hospital.            [facility] is not responsible for any belongings or valuables.  Do NOT Smoke (Tobacco/Vapping) or drink Alcohol 24 hours prior to your procedure If you use a CPAP at night, you may bring all equipment for your overnight stay.   Contacts, glasses, dentures or bridgework may not be worn into surgery.      For patients admitted to the hospital, discharge time will be determined by your treatment team.   Patients discharged the day of surgery will not be allowed to drive home, and someone needs to stay with them for 24 hours.    Special instructions:   [facility]- Preparing For  Surgery  Before surgery, you can play an important role. Because skin is not sterile, your skin needs to be as free of germs as possible. You can reduce the number of germs on your skin by washing with CHG (chlorahexidine gluconate) Soap before surgery.  CHG is an antiseptic cleaner which kills germs and bonds with the skin to continue killing germs even after washing.    Oral Hygiene is also important to reduce your risk of infection.  Remember - BRUSH YOUR TEETH THE MORNING OF SURGERY WITH YOUR REGULAR TOOTHPASTE  Please do not use if you have an allergy to CHG or antibacterial soaps. If your skin becomes reddened/irritated stop using the CHG.  Do not shave (including legs and underarms) for at least 48 hours prior to first CHG shower. It is OK to shave your face.  Please follow these instructions carefully.   1. Shower the NIGHT BEFORE SURGERY and the MORNING OF SURGERY with CHG Soap.   2. If you chose to wash your hair, wash your hair first as usual with your normal shampoo.  3. After you shampoo, rinse your hair and body thoroughly to remove the  shampoo.  4. Use CHG as you would any other liquid soap. You can apply CHG directly to the skin and wash gently with a scrungie or a clean washcloth.   5. Apply the CHG Soap to your body ONLY FROM THE NECK DOWN.  Do not use on open wounds or open sores. Avoid contact with your eyes, ears, mouth and genitals (private parts). Wash Face and genitals (private parts)  with your normal soap.   6. Wash thoroughly, paying special attention to the area where your surgery will be performed.  7. Thoroughly rinse your body with warm water from the neck down.  8. DO NOT shower/wash with your normal soap after using and rinsing off the CHG Soap.  9. Pat yourself dry with a CLEAN TOWEL.  10. Wear CLEAN PAJAMAS to bed the night before surgery, wear comfortable clothes the morning of surgery  11. Place CLEAN SHEETS on your bed the night of your first  shower and DO NOT SLEEP WITH PETS.   Day of Surgery:   Do not apply any deodorants/lotions.  Please wear clean clothes to the hospital/surgery center.   Remember to brush your teeth WITH YOUR REGULAR TOOTHPASTE.   Please read over the following fact sheets that you were given.

## 2019-08-30 NOTE — H&P (View-Only) (Signed)
GYNECOLOGY OFFICE VISIT NOTE  History:   Grace Robertson is a 39 y.o. G0P0000 here today for preoperative visit.  Scheduled for TAH/BS on 09/04/19. Just received iron infusion to help with her IDA.  She denies any current abnormal vaginal discharge, bleeding, pelvic pain or other concerns.    Past Medical History:  Diagnosis Date  . Heart murmur    as baby  . Iron deficiency anemia due to chronic blood loss   . Menometrorrhagia 03/15/2019    Past Surgical History:  Procedure Laterality Date  . OPEN REDUCTION INTERNAL FIXATION (ORIF) METACARPAL Left 08/15/2013   Procedure: OPEN REDUCTION INTERNAL FIXATION (ORIF) LEFT SMALL FINGER;  Surgeon: Jolyn Nap, MD;  Location: [facility];  Service: Orthopedics;  Laterality: Left;    The following portions of the patient's history were reviewed and updated as appropriate: allergies, current medications, past family history, past medical history, past social history, past surgical history and problem list.   Health Maintenance:  Normal pap and negative HRHPV on 04/09/2019.  Review of Systems:  Pertinent items noted in HPI and remainder of comprehensive ROS otherwise negative.  Physical Exam:  BP (!) 149/96   Pulse 81   Resp 16   Ht 5\' 4"  (1.626 m)   Wt 190 lb (86.2 kg)   LMP 08/24/2019   BMI 32.61 kg/m  CONSTITUTIONAL: Well-developed, well-nourished female in no acute distress.  HEENT:  Normocephalic, atraumatic. External right and left ear normal. No scleral icterus.  NECK: Normal range of motion, supple, no masses noted on observation SKIN: No rash noted. Not diaphoretic. No erythema. No pallor. MUSCULOSKELETAL: Normal range of motion. No edema noted. NEUROLOGIC: Alert and oriented to person, place, and time. Normal muscle tone coordination. No cranial nerve deficit noted. PSYCHIATRIC: Normal mood and affect. Normal behavior. Normal judgment and thought content. CARDIOVASCULAR: Normal heart rate noted RESPIRATORY:  Effort and breath sounds normal, no problems with respiration noted ABDOMEN: Over 20 week size uterus noted. No other overt distention noted.   PELVIC: Deferred  Labs and Imaging CBC Latest Ref Rng & Units 07/06/2019 05/11/2019 03/14/2019  WBC 4.0 - 10.5 K/uL 2.5(L) 2.5(L) 2.9(L)  Hemoglobin 12.0 - 15.0 g/dL 11.3(L) 9.4(L) 7.1(L)  Hematocrit 36 - 46 % 38.9 34.8(L) 27.3(L)  Platelets 150 - 400 K/uL [phone]      Assessment and Plan:      1. Preoperative exam for gynecologic surgery 2. Menometrorrhagia 3. Iron deficiency anemia due to chronic blood loss 4. Uterine leiomyoma, unspecified location Patient is scheduled for total abdominal hysterectomy (TAH) and bilateral salpingectomy on 09/04/2019.  The risks of surgery were discussed in detail with the patient including but not limited to: bleeding which may require transfusion or reoperation; infection which may require antibiotics; injury to bowel, bladder, ureters or other surrounding organs; need for additional procedures including laparotomy; formation of adhesions; thromboembolic phenomenon; incisional problems and other postoperative/anesthesia complications.  Patient was also advised that she will remain in house for 1-2 nights; and expected recovery time after a hysterectomy is 6-8 weeks.  Patient was told that the likelihood that her condition and symptoms will be treated effectively with this surgical management was very high; the postoperative expectations were also discussed in detail. The patient also understands the alternative treatment options which were discussed in full. All questions were answered.  Routine preoperative instructions will be given to her by the preoperative nursing team.   She is aware of need for preoperative COVID testing and  subsequent quarantine from time of test to time of surgery; she will be given further preoperative instructions at that [place] screening visit.   Routine postoperative instructions will be  reviewed with the patient in detail after surgery. Printed patient education handouts about the procedure was given to the patient to review at home.   Routine preventative health maintenance measures emphasized. Please refer to After Visit Summary for other counseling recommendations.   Return in about 3 weeks (around 09/20/2019) for Postoperative exam.    Total face-to-face time with patient: 25 minutes.  Over 50% of encounter was spent on counseling and coordination of care.   Verita Schneiders, MD, [place] for [employer], [place]

## 2019-08-30 NOTE — Patient Instructions (Signed)

## 2019-08-30 NOTE — Progress Notes (Signed)
GYNECOLOGY OFFICE VISIT NOTE  History:   Grace Robertson is a 39 y.o. G0P0000 here today for preoperative visit.  Scheduled for TAH/BS on 09/04/19. Just received iron infusion to help with her IDA.  She denies any current abnormal vaginal discharge, bleeding, pelvic pain or other concerns.    Past Medical History:  Diagnosis Date  . Heart murmur    as baby  . Iron deficiency anemia due to chronic blood loss   . Menometrorrhagia 03/15/2019    Past Surgical History:  Procedure Laterality Date  . OPEN REDUCTION INTERNAL FIXATION (ORIF) METACARPAL Left 08/15/2013   Procedure: OPEN REDUCTION INTERNAL FIXATION (ORIF) LEFT SMALL FINGER;  Surgeon: Jolyn Nap, MD;  Location: [place];  Service: Orthopedics;  Laterality: Left;    The following portions of the patient's history were reviewed and updated as appropriate: allergies, current medications, past family history, past medical history, past social history, past surgical history and problem list.   Health Maintenance:  Normal pap and negative HRHPV on 04/09/2019.  Review of Systems:  Pertinent items noted in HPI and remainder of comprehensive ROS otherwise negative.  Physical Exam:  BP (!) 149/96   Pulse 81   Resp 16   Ht 5\' 4"  (1.626 m)   Wt 190 lb (86.2 kg)   LMP 08/24/2019   BMI 32.61 kg/m  CONSTITUTIONAL: Well-developed, well-nourished female in no acute distress.  HEENT:  Normocephalic, atraumatic. External right and left ear normal. No scleral icterus.  NECK: Normal range of motion, supple, no masses noted on observation SKIN: No rash noted. Not diaphoretic. No erythema. No pallor. MUSCULOSKELETAL: Normal range of motion. No edema noted. NEUROLOGIC: Alert and oriented to person, place, and time. Normal muscle tone coordination. No cranial nerve deficit noted. PSYCHIATRIC: Normal mood and affect. Normal behavior. Normal judgment and thought content. CARDIOVASCULAR: Normal heart rate noted RESPIRATORY:  Effort and breath sounds normal, no problems with respiration noted ABDOMEN: Over 20 week size uterus noted. No other overt distention noted.   PELVIC: Deferred  Labs and Imaging CBC Latest Ref Rng & Units 07/06/2019 05/11/2019 03/14/2019  WBC 4.0 - 10.5 K/uL 2.5(L) 2.5(L) 2.9(L)  Hemoglobin 12.0 - 15.0 g/dL 11.3(L) 9.4(L) 7.1(L)  Hematocrit 36 - 46 % 38.9 34.8(L) 27.3(L)  Platelets 150 - 400 K/uL 281 342 355      Assessment and Plan:      1. Preoperative exam for gynecologic surgery 2. Menometrorrhagia 3. Iron deficiency anemia due to chronic blood loss 4. Uterine leiomyoma, unspecified location Patient is scheduled for total abdominal hysterectomy (TAH) and bilateral salpingectomy on 09/04/2019.  The risks of surgery were discussed in detail with the patient including but not limited to: bleeding which may require transfusion or reoperation; infection which may require antibiotics; injury to bowel, bladder, ureters or other surrounding organs; need for additional procedures including laparotomy; formation of adhesions; thromboembolic phenomenon; incisional problems and other postoperative/anesthesia complications.  Patient was also advised that she will remain in house for 1-2 nights; and expected recovery time after a hysterectomy is 6-8 weeks.  Patient was told that the likelihood that her condition and symptoms will be treated effectively with this surgical management was very high; the postoperative expectations were also discussed in detail. The patient also understands the alternative treatment options which were discussed in full. All questions were answered.  Routine preoperative instructions will be given to her by the preoperative nursing team.   She is aware of need for preoperative COVID testing and  subsequent quarantine from time of test to time of surgery; she will be given further preoperative instructions at that [place] screening visit.   Routine postoperative instructions will be  reviewed with the patient in detail after surgery. Printed patient education handouts about the procedure was given to the patient to review at home.   Routine preventative health maintenance measures emphasized. Please refer to After Visit Summary for other counseling recommendations.   Return in about 3 weeks (around 09/20/2019) for Postoperative exam.    Total face-to-face time with patient: 25 minutes.  Over 50% of encounter was spent on counseling and coordination of care.   Verita Schneiders, MD, [place] for [employer], [place]

## 2019-08-31 ENCOUNTER — Encounter (HOSPITAL_COMMUNITY)
Admission: RE | Admit: 2019-08-31 | Discharge: 2019-08-31 | Disposition: A | Discharge status: Home / Self Care | Payer: Managed Care, Other (non HMO) | Source: Ambulatory Visit | Attending: Obstetrics & Gynecology | Admitting: Obstetrics & Gynecology

## 2019-08-31 ENCOUNTER — Other Ambulatory Visit (HOSPITAL_COMMUNITY)
Admission: RE | Admit: 2019-08-31 | Discharge: 2019-08-31 | Disposition: A | Discharge status: Home / Self Care | Payer: Managed Care, Other (non HMO) | Source: Ambulatory Visit | Attending: Obstetrics & Gynecology | Admitting: Obstetrics & Gynecology

## 2019-08-31 ENCOUNTER — Encounter (HOSPITAL_COMMUNITY): Payer: Self-pay

## 2019-08-31 DIAGNOSIS — Z01818 Encounter for other preprocedural examination: Secondary | ICD-10-CM | POA: Diagnosis present

## 2019-08-31 DIAGNOSIS — R030 Elevated blood-pressure reading, without diagnosis of hypertension: Secondary | ICD-10-CM | POA: Insufficient documentation

## 2019-08-31 DIAGNOSIS — Z20822 Contact with and (suspected) exposure to covid-19: Secondary | ICD-10-CM | POA: Insufficient documentation

## 2019-08-31 LAB — BASIC METABOLIC PANEL
Anion gap: 8 (ref 5–15)
BUN: 10 mg/dL (ref 6–20)
CO2: 25 mmol/L (ref 22–32)
Calcium: 8.8 mg/dL — ABNORMAL LOW (ref 8.9–10.3)
Chloride: 104 mmol/L (ref 98–111)
Creatinine, Ser: 0.87 mg/dL (ref 0.44–1.00)
GFR calc Af Amer: >60 mL/min (ref >60)
GFR calc non Af Amer: >60 mL/min (ref >60)
Glucose, Bld: 91 mg/dL (ref 70–99)
Potassium: 3.5 mmol/L (ref 3.5–5.1)
Sodium: 137 mmol/L (ref 135–145)

## 2019-08-31 LAB — CBC
HCT: 41.6 % (ref 36.0–46.0)
Hemoglobin: 12 g/dL (ref 12.0–15.0)
MCH: 23.7 pg — ABNORMAL LOW (ref 26.0–34.0)
MCHC: 28.8 g/dL — ABNORMAL LOW (ref 30.0–36.0)
MCV: 82.1 fL (ref 80.0–100.0)
Platelets: 332 10*3/uL (ref 150–400)
RBC: 5.07 MIL/uL (ref 3.87–5.11)
RDW: 20.3 % — ABNORMAL HIGH (ref 11.5–15.5)
WBC: 3 10*3/uL — ABNORMAL LOW (ref 4.0–10.5)
nRBC: 0 % (ref 0.0–0.2)

## 2019-08-31 LAB — TYPE AND SCREEN
ABO/RH(D): O POS
Antibody Screen: NEGATIVE

## 2019-08-31 LAB — ABO/RH: ABO/RH(D): O POS

## 2019-08-31 LAB — SARS CORONAVIRUS 2 (TAT 6-24 HRS): SARS Coronavirus 2: NEGATIVE

## 2019-08-31 NOTE — Progress Notes (Signed)
PCP - Emeterio Reeve, DO Cardiologist - denies  Chest x-ray - n/a EKG - 08-31-19 (ordered per Ebony Hail)  ERAS Protcol - yes, no drink ordered    COVID TEST- Friday 08-31-19   Anesthesia review: yes, high BP @ PAT appointment Ebony Hail notified.  EKG ordered.   Patient stated that her BP (diastolic) has been running in the high 90's for the past couple of months.  Patient has not followed up with PCP.   BP rechecked at end of PAT appointment 149/109  Patient instructed (per Ebony Hail) to check BP over the weekend.  Ebony Hail to follow up on Monday (09-03-19).  Patient denies shortness of breath, fever, cough and chest pain at PAT appointment   All instructions explained to the patient, with a verbal understanding of the material. Patient agrees to go over the instructions while at home for a better understanding. Patient also instructed to self quarantine after being tested for COVID-19. The opportunity to ask questions was provided.

## 2019-09-03 NOTE — Anesthesia Preprocedure Evaluation (Addendum)
Anesthesia Evaluation  Patient identified by MRN, date of birth, ID band Patient awake    Reviewed: Allergy & Precautions, NPO status , Patient's Chart, lab work & pertinent test results  Airway Mallampati: I  TM Distance: >3 FB Neck ROM: Full    Dental no notable dental hx. (+) Teeth Intact, Dental Advisory Given   Pulmonary neg pulmonary ROS,    Pulmonary exam normal breath sounds clear to auscultation       Cardiovascular negative cardio ROS Normal cardiovascular exam Rhythm:Regular Rate:Normal     Neuro/Psych negative neurological ROS  negative psych ROS   GI/Hepatic negative GI ROS, Neg liver ROS,   Endo/Other  negative endocrine ROS  Renal/GU negative Renal ROS  negative genitourinary   Musculoskeletal negative musculoskeletal ROS (+)   Abdominal   Peds  Hematology  (+) Blood dyscrasia, anemia ,   Anesthesia Other Findings   Reproductive/Obstetrics                            Anesthesia Physical Anesthesia Plan  ASA: II  Anesthesia Plan: General and Regional   Post-op Pain Management:  Regional for Post-op pain   Induction: Intravenous  PONV Risk Score and Plan: 3 and Midazolam, Dexamethasone and Ondansetron  Airway Management Planned: Oral ETT  Additional Equipment:   Intra-op Plan:   Post-operative Plan: Extubation in OR  Informed Consent: I have reviewed the patients History and Physical, chart, labs and discussed the procedure including the risks, benefits and alternatives for the proposed anesthesia with the patient or authorized representative who has indicated his/her understanding and acceptance.     Dental advisory given  Plan Discussed with: CRNA  Anesthesia Plan Comments:        Anesthesia Quick Evaluation

## 2019-09-03 NOTE — Progress Notes (Addendum)
Anesthesia Chart Review:  Case: [id number] Date/Time: 09/04/19 1437   Procedure: HYSTERECTOMY ABDOMINAL WITH SALPINGECTOMY (Bilateral ) - REQUESTING TAP BLOCK   Anesthesia type: Choice   Pre-op diagnosis:      FIBROIDS     MENORRHAGIA   Location: [facility] OR ROOM 07 / [facility]   Surgeons: Osborne Oman, MD      DISCUSSION: Patient is a 39 year old female scheduled for the above procedure.  History includes never smoker, infant murmur, menorrhagia, anemia. Left small finger PIP fracture dislocation (sustained while playing softball), s/p external fixation 08/15/13.   She became established with Emeterio Reeve, DO on 03/05/19 and noted to have borderline elevated BP (136/89). No murmur noted on exam. She was referred to hematology due to iron deficiency anemia (HGB 6.8). She ultimately received several iron infusions. Heavy menses felt to be contributing to anemia. 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 [employer], this is a very common mutation and is frequently seen in association with other hemoglobinopathies, such as Hbs S, C, E".)  She is a police office, currently on leave given surgery plans. 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 Reports no issues with murmur as an adult. No chest pain, SOB, syncope. She is concerned that her BP will be elevated on arrival for surgery--she is quite anxious about surgery. She historically has not had DBP > 100 except during her 08/31/19 PAT visit. She has had DBP in the 90's and reports BP runs higher at medical visits. She does check BP at home and reported one BP 130's/80's and other  DBP readings in the 90's (with no DBP in the 100's). She has never required medication for treatment of her hypertension. She is not on anxiety medication, although required something for anxiety for vaginal ultrasound. I advised that she continue to monitor BP and record. Anxiety likely contributing to elevated BP, although noted to have "borderline" HTN in the past. Voice message left for Jacinda at Dr. Arther Abbott office, and  I sent a staff message to Dr. Harolyn Rutherford and discussed with anesthesiologist Jillyn Hidden, Mateo Flow, MD. Patient will get vitals on arrival.    08/31/19 presurgical COVID-19 test was negative. She is for urine pregnancy test on the day of surgery.    VS: BP (!) 149/109 Comment: Peri Jefferson, RN aware  Pulse 73   Temp 36.9 C (Oral)   Resp 18   Ht 5\' 4"  (1.626 m)   Wt 86.5 kg   LMP 08/24/2019   SpO2 100%   BMI 32.73 kg/m   PAT BP 137/102, 149/109. Reported DBP typically in the 90's. BP Readings from Last 3 Encounters:  08/31/19 (!) 149/109  08/30/19 (!) 149/96  08/30/19 (!) 129/97    PROVIDERS: Emeterio Reeve, Kara Dies, MD is hematologist. Last visti 07/06/19 with Laverna Peace, NP.   LABS: Labs reviewed: Acceptable for surgery. (all labs ordered are listed, but only abnormal results are displayed)  Labs Reviewed  BASIC METABOLIC PANEL - Abnormal; Notable for the following components:      Result Value   Calcium 8.8 (*)    All other components within normal limits  CBC - Abnormal; Notable for the following components:   WBC 3.0 (*)  MCH 23.7 (*)    MCHC 28.8 (*)    RDW 20.3 (*)    All other components within normal limits  TYPE AND SCREEN  ABO/RH    EKG: 08/31/19:  Normal sinus rhythm Minimal voltage criteria for LVH, may be normal variant ( R in aVL ) Nonspecific T wave abnormality Abnormal ECG No previous tracing Confirmed by Lauree Chandler [id number]) on 08/31/2019 1:33:56 PM - EKG ordered due to elevated BP without formal HTN  diagnosis at her 08/31/19 PAT visit.  CV: N/A  Past Medical History:  Diagnosis Date  . Heart murmur    as baby  . Iron deficiency anemia due to chronic blood loss   . Menometrorrhagia 03/15/2019    Past Surgical History:  Procedure Laterality Date  . OPEN REDUCTION INTERNAL FIXATION (ORIF) METACARPAL Left 08/15/2013   Procedure: OPEN REDUCTION INTERNAL FIXATION (ORIF) LEFT SMALL FINGER;  Surgeon: Jolyn Nap, MD;  Location: [place];  Service: Orthopedics;  Laterality: Left;    MEDICATIONS: . tranexamic acid (LYSTEDA) 650 MG TABS tablet   No current facility-administered medications for this encounter.    Myra Gianotti, PA-C Surgical Short Stay/Anesthesiology [facility] Phone [phone] [facility] Phone [phone] 09/03/2019 2:41 PM

## 2019-09-04 ENCOUNTER — Encounter (HOSPITAL_COMMUNITY)
Admission: RE | Disposition: A | Discharge status: Home / Self Care | Payer: Self-pay | Source: Home / Self Care | Attending: Obstetrics & Gynecology

## 2019-09-04 ENCOUNTER — Other Ambulatory Visit: Payer: Self-pay

## 2019-09-04 ENCOUNTER — Encounter (HOSPITAL_COMMUNITY): Payer: Self-pay | Admitting: Obstetrics & Gynecology

## 2019-09-04 ENCOUNTER — Inpatient Hospital Stay (HOSPITAL_COMMUNITY): Payer: Managed Care, Other (non HMO)

## 2019-09-04 ENCOUNTER — Inpatient Hospital Stay (HOSPITAL_COMMUNITY): Payer: Managed Care, Other (non HMO) | Admitting: Vascular Surgery

## 2019-09-04 ENCOUNTER — Inpatient Hospital Stay (HOSPITAL_COMMUNITY)
Admission: RE | Admit: 2019-09-04 | Discharge: 2019-09-05 | DRG: 743 | Disposition: A | Discharge status: Home / Self Care | Payer: Managed Care, Other (non HMO) | Attending: Obstetrics & Gynecology | Admitting: Obstetrics & Gynecology

## 2019-09-04 DIAGNOSIS — N921 Excessive and frequent menstruation with irregular cycle: Secondary | ICD-10-CM | POA: Diagnosis present

## 2019-09-04 DIAGNOSIS — D509 Iron deficiency anemia, unspecified: Secondary | ICD-10-CM | POA: Diagnosis present

## 2019-09-04 DIAGNOSIS — Z20822 Contact with and (suspected) exposure to covid-19: Secondary | ICD-10-CM | POA: Diagnosis present

## 2019-09-04 DIAGNOSIS — D500 Iron deficiency anemia secondary to blood loss (chronic): Secondary | ICD-10-CM | POA: Diagnosis present

## 2019-09-04 DIAGNOSIS — N939 Abnormal uterine and vaginal bleeding, unspecified: Secondary | ICD-10-CM

## 2019-09-04 DIAGNOSIS — Z90710 Acquired absence of both cervix and uterus: Secondary | ICD-10-CM

## 2019-09-04 DIAGNOSIS — D259 Leiomyoma of uterus, unspecified: Secondary | ICD-10-CM

## 2019-09-04 HISTORY — PX: HYSTERECTOMY ABDOMINAL WITH SALPINGECTOMY: SHX6725

## 2019-09-04 LAB — POCT PREGNANCY, URINE: Preg Test, Ur: NEGATIVE

## 2019-09-04 SURGERY — HYSTERECTOMY, TOTAL, ABDOMINAL, WITH SALPINGECTOMY
Anesthesia: Regional | Site: Abdomen | Laterality: Bilateral

## 2019-09-04 MED ORDER — GABAPENTIN 100 MG PO CAPS
100.0000 mg | ORAL_CAPSULE | Freq: Two times a day (BID) | ORAL | Status: DC
  Administered 2019-09-04 – 2019-09-05 (×2): 100 mg via ORAL
  Filled 2019-09-04 (×2): qty 1

## 2019-09-04 MED ORDER — FENTANYL CITRATE (PF) 100 MCG/2ML IJ SOLN
INTRAMUSCULAR | Status: AC
  Filled 2019-09-04: qty 2

## 2019-09-04 MED ORDER — LIDOCAINE 2% (20 MG/ML) 5 ML SYRINGE
INTRAMUSCULAR | Status: AC
  Filled 2019-09-04: qty 5

## 2019-09-04 MED ORDER — EPHEDRINE SULFATE 50 MG/ML IJ SOLN
INTRAMUSCULAR | Status: DC | PRN
Start: 2019-09-04 — End: 2019-09-04
  Administered 2019-09-04: 10 mg via INTRAVENOUS

## 2019-09-04 MED ORDER — ROCURONIUM BROMIDE 10 MG/ML (PF) SYRINGE
PREFILLED_SYRINGE | INTRAVENOUS | Status: DC | PRN
  Administered 2019-09-04: 80 mg via INTRAVENOUS
  Administered 2019-09-04: 20 mg via INTRAVENOUS

## 2019-09-04 MED ORDER — ZOLPIDEM TARTRATE 5 MG PO TABS: 5.0000 mg | ORAL_TABLET | Freq: Every evening | ORAL | Status: DC | PRN

## 2019-09-04 MED ORDER — HEMOSTATIC AGENTS (NO CHARGE) OPTIME
TOPICAL | Status: DC | PRN
  Administered 2019-09-04: 1 via TOPICAL

## 2019-09-04 MED ORDER — BISACODYL 10 MG RE SUPP: 10.0000 mg | Freq: Every day | RECTAL | Status: DC | PRN

## 2019-09-04 MED ORDER — FENTANYL CITRATE (PF) 250 MCG/5ML IJ SOLN
INTRAMUSCULAR | Status: AC
  Filled 2019-09-04: qty 5

## 2019-09-04 MED ORDER — ONDANSETRON HCL 4 MG/2ML IJ SOLN
INTRAMUSCULAR | Status: AC
  Filled 2019-09-04: qty 2

## 2019-09-04 MED ORDER — ACETAMINOPHEN 500 MG PO TABS
1000.0000 mg | ORAL_TABLET | Freq: Four times a day (QID) | ORAL | Status: DC
  Administered 2019-09-04 – 2019-09-05 (×3): 1000 mg via ORAL
  Filled 2019-09-04 (×4): qty 2

## 2019-09-04 MED ORDER — PANTOPRAZOLE SODIUM 40 MG PO TBEC
40.0000 mg | DELAYED_RELEASE_TABLET | Freq: Every day | ORAL | Status: DC
  Administered 2019-09-04 – 2019-09-05 (×2): 40 mg via ORAL
  Filled 2019-09-04 (×2): qty 1

## 2019-09-04 MED ORDER — MAGNESIUM CITRATE PO SOLN: 1.0000 | Freq: Once | ORAL | Status: DC | PRN

## 2019-09-04 MED ORDER — ORAL CARE MOUTH RINSE: 15.0000 mL | Freq: Once | OROMUCOSAL | Status: AC

## 2019-09-04 MED ORDER — ONDANSETRON HCL 4 MG PO TABS: 4.0000 mg | ORAL_TABLET | Freq: Four times a day (QID) | ORAL | Status: DC | PRN

## 2019-09-04 MED ORDER — DEXAMETHASONE SODIUM PHOSPHATE 10 MG/ML IJ SOLN
INTRAMUSCULAR | Status: AC
  Filled 2019-09-04: qty 1

## 2019-09-04 MED ORDER — KETAMINE HCL 10 MG/ML IJ SOLN
INTRAMUSCULAR | Status: DC | PRN
  Administered 2019-09-04: 10 mg via INTRAVENOUS
  Administered 2019-09-04: 30 mg via INTRAVENOUS

## 2019-09-04 MED ORDER — ACETAMINOPHEN 500 MG PO TABS: 1000.0000 mg | ORAL_TABLET | ORAL | Status: AC

## 2019-09-04 MED ORDER — PROPOFOL 10 MG/ML IV BOLUS
INTRAVENOUS | Status: DC | PRN
  Administered 2019-09-04: 150 mg via INTRAVENOUS

## 2019-09-04 MED ORDER — MENTHOL 3 MG MT LOZG: 1.0000 | LOZENGE | OROMUCOSAL | Status: DC | PRN

## 2019-09-04 MED ORDER — MIDAZOLAM HCL 2 MG/2ML IJ SOLN
INTRAMUSCULAR | Status: AC
  Filled 2019-09-04: qty 2

## 2019-09-04 MED ORDER — EPHEDRINE 5 MG/ML INJ
INTRAVENOUS | Status: AC
  Filled 2019-09-04: qty 10

## 2019-09-04 MED ORDER — SUGAMMADEX SODIUM 200 MG/2ML IV SOLN
INTRAVENOUS | Status: DC | PRN
  Administered 2019-09-04: 200 mg via INTRAVENOUS

## 2019-09-04 MED ORDER — KETOROLAC TROMETHAMINE 30 MG/ML IJ SOLN
30.0000 mg | Freq: Four times a day (QID) | INTRAMUSCULAR | Status: DC
  Administered 2019-09-05 (×3): 30 mg via INTRAVENOUS
  Filled 2019-09-04 (×4): qty 1

## 2019-09-04 MED ORDER — FENTANYL CITRATE (PF) 100 MCG/2ML IJ SOLN: 100.0000 ug | Freq: Once | INTRAMUSCULAR | Status: AC

## 2019-09-04 MED ORDER — DEXAMETHASONE SODIUM PHOSPHATE 10 MG/ML IJ SOLN
INTRAMUSCULAR | Status: DC | PRN
Start: 2019-09-04 — End: 2019-09-04
  Administered 2019-09-04: 5 mg via INTRAVENOUS

## 2019-09-04 MED ORDER — LACTATED RINGERS IV SOLN: INTRAVENOUS | Status: DC

## 2019-09-04 MED ORDER — SODIUM CHLORIDE 0.9 % IV SOLN
INTRAVENOUS | Status: AC
  Filled 2019-09-04: qty 2

## 2019-09-04 MED ORDER — ONDANSETRON HCL 4 MG/2ML IJ SOLN: 4.0000 mg | Freq: Four times a day (QID) | INTRAMUSCULAR | Status: DC | PRN

## 2019-09-04 MED ORDER — GLYCOPYRROLATE PF 0.2 MG/ML IJ SOSY
PREFILLED_SYRINGE | INTRAMUSCULAR | Status: AC
  Filled 2019-09-04: qty 1

## 2019-09-04 MED ORDER — FENTANYL CITRATE (PF) 100 MCG/2ML IJ SOLN
INTRAMUSCULAR | Status: DC | PRN
  Administered 2019-09-04: 100 ug via INTRAVENOUS
  Administered 2019-09-04: 50 ug via INTRAVENOUS

## 2019-09-04 MED ORDER — FENTANYL CITRATE (PF) 100 MCG/2ML IJ SOLN
INTRAMUSCULAR | Status: AC
  Administered 2019-09-04: 100 ug via INTRAVENOUS
  Filled 2019-09-04: qty 2

## 2019-09-04 MED ORDER — CHLORHEXIDINE GLUCONATE 0.12 % MT SOLN: 15.0000 mL | Freq: Once | OROMUCOSAL | Status: AC

## 2019-09-04 MED ORDER — DOCUSATE SODIUM 100 MG PO CAPS
100.0000 mg | ORAL_CAPSULE | Freq: Two times a day (BID) | ORAL | Status: DC
  Administered 2019-09-04 – 2019-09-05 (×2): 100 mg via ORAL
  Filled 2019-09-04 (×2): qty 1

## 2019-09-04 MED ORDER — BUPIVACAINE HCL (PF) 0.5 % IJ SOLN
INTRAMUSCULAR | Status: DC | PRN
  Administered 2019-09-04: 30 mL

## 2019-09-04 MED ORDER — BUPIVACAINE HCL (PF) 0.25 % IJ SOLN
INTRAMUSCULAR | Status: DC | PRN
  Administered 2019-09-04 (×2): 30 mL

## 2019-09-04 MED ORDER — SIMETHICONE 80 MG PO CHEW: 80.0000 mg | CHEWABLE_TABLET | Freq: Four times a day (QID) | ORAL | Status: DC | PRN

## 2019-09-04 MED ORDER — SODIUM CHLORIDE 0.9 % IV SOLN
INTRAVENOUS | Status: DC | PRN
  Administered 2019-09-04: 50 ug/min via INTRAVENOUS

## 2019-09-04 MED ORDER — KETOROLAC TROMETHAMINE 30 MG/ML IJ SOLN
INTRAMUSCULAR | Status: AC
  Filled 2019-09-04: qty 1

## 2019-09-04 MED ORDER — ALUM & MAG HYDROXIDE-SIMETH 200-200-20 MG/5ML PO SUSP: 30.0000 mL | ORAL | Status: DC | PRN

## 2019-09-04 MED ORDER — DEXAMETHASONE SODIUM PHOSPHATE 10 MG/ML IJ SOLN: INTRAMUSCULAR | Status: DC | PRN

## 2019-09-04 MED ORDER — MIDAZOLAM HCL 2 MG/2ML IJ SOLN
INTRAMUSCULAR | Status: AC
  Administered 2019-09-04: 2 mg via INTRAVENOUS
  Filled 2019-09-04: qty 2

## 2019-09-04 MED ORDER — DEXAMETHASONE SODIUM PHOSPHATE 10 MG/ML IJ SOLN
INTRAMUSCULAR | Status: DC | PRN
  Administered 2019-09-04 (×2): 5 mg

## 2019-09-04 MED ORDER — BUPIVACAINE HCL (PF) 0.5 % IJ SOLN
INTRAMUSCULAR | Status: AC
  Filled 2019-09-04: qty 30

## 2019-09-04 MED ORDER — ACETAMINOPHEN 500 MG PO TABS
ORAL_TABLET | ORAL | Status: AC
  Administered 2019-09-04: 1000 mg via ORAL
  Filled 2019-09-04: qty 2

## 2019-09-04 MED ORDER — ONDANSETRON HCL 4 MG/2ML IJ SOLN
INTRAMUSCULAR | Status: DC | PRN
  Administered 2019-09-04: 4 mg via INTRAVENOUS

## 2019-09-04 MED ORDER — MIDAZOLAM HCL 2 MG/2ML IJ SOLN: 2.0000 mg | Freq: Once | INTRAMUSCULAR | Status: AC

## 2019-09-04 MED ORDER — SOD CITRATE-CITRIC ACID 500-334 MG/5ML PO SOLN: 30.0000 mL | ORAL | Status: AC

## 2019-09-04 MED ORDER — SODIUM CHLORIDE 0.9 % IV SOLN
2.0000 g | INTRAVENOUS | Status: AC
  Administered 2019-09-04: 2 g via INTRAVENOUS

## 2019-09-04 MED ORDER — ROCURONIUM BROMIDE 10 MG/ML (PF) SYRINGE
PREFILLED_SYRINGE | INTRAVENOUS | Status: AC
  Filled 2019-09-04: qty 10

## 2019-09-04 MED ORDER — HYDROMORPHONE HCL 1 MG/ML IJ SOLN: 1.0000 mg | INTRAMUSCULAR | Status: DC | PRN

## 2019-09-04 MED ORDER — PROPOFOL 10 MG/ML IV BOLUS
INTRAVENOUS | Status: AC
  Filled 2019-09-04: qty 20

## 2019-09-04 MED ORDER — KETOROLAC TROMETHAMINE 30 MG/ML IJ SOLN
30.0000 mg | Freq: Once | INTRAMUSCULAR | Status: AC
  Administered 2019-09-04: 30 mg via INTRAVENOUS

## 2019-09-04 MED ORDER — ESMOLOL HCL 100 MG/10ML IV SOLN
INTRAVENOUS | Status: DC | PRN
  Administered 2019-09-04: 20 mg via INTRAVENOUS

## 2019-09-04 MED ORDER — CHLORHEXIDINE GLUCONATE 0.12 % MT SOLN
OROMUCOSAL | Status: AC
  Administered 2019-09-04: 15 mL via OROMUCOSAL
  Filled 2019-09-04: qty 15

## 2019-09-04 MED ORDER — SOD CITRATE-CITRIC ACID 500-334 MG/5ML PO SOLN
ORAL | Status: AC
  Administered 2019-09-04: 30 mL via ORAL
  Filled 2019-09-04: qty 15

## 2019-09-04 MED ORDER — OXYCODONE HCL 5 MG PO TABS: 5.0000 mg | ORAL_TABLET | ORAL | Status: DC | PRN

## 2019-09-04 MED ORDER — LIDOCAINE 2% (20 MG/ML) 5 ML SYRINGE
INTRAMUSCULAR | Status: DC | PRN
  Administered 2019-09-04: 20 mg via INTRAVENOUS

## 2019-09-04 MED ORDER — DIPHENHYDRAMINE HCL 50 MG/ML IJ SOLN
INTRAMUSCULAR | Status: DC | PRN
Start: 2019-09-04 — End: 2019-09-04
  Administered 2019-09-04: 12.5 mg via INTRAVENOUS

## 2019-09-04 MED ORDER — FENTANYL CITRATE (PF) 100 MCG/2ML IJ SOLN
25.0000 ug | INTRAMUSCULAR | Status: DC | PRN
  Administered 2019-09-04: 50 ug via INTRAVENOUS

## 2019-09-04 MED ORDER — SENNOSIDES-DOCUSATE SODIUM 8.6-50 MG PO TABS: 1.0000 | ORAL_TABLET | Freq: Every evening | ORAL | Status: DC | PRN

## 2019-09-04 MED ORDER — GLYCOPYRROLATE PF 0.2 MG/ML IJ SOSY
PREFILLED_SYRINGE | INTRAMUSCULAR | Status: DC | PRN
  Administered 2019-09-04: .2 mg via INTRAVENOUS

## 2019-09-04 MED ORDER — KETAMINE HCL 50 MG/5ML IJ SOSY
PREFILLED_SYRINGE | INTRAMUSCULAR | Status: AC
  Filled 2019-09-04: qty 5

## 2019-09-04 MED ORDER — IBUPROFEN 800 MG PO TABS: 800.0000 mg | ORAL_TABLET | Freq: Four times a day (QID) | ORAL | Status: DC

## 2019-09-04 SURGICAL SUPPLY — 48 items
APL SKNCLS STERI-STRIP NONHPOA (GAUZE/BANDAGES/DRESSINGS)
BENZOIN TINCTURE PRP APPL 2/3 (GAUZE/BANDAGES/DRESSINGS) IMPLANT
CANISTER SUCT 3000ML PPV (MISCELLANEOUS) ×3 IMPLANT
CELLS DAT CNTRL 66122 CELL SVR (MISCELLANEOUS) IMPLANT
CLOSURE WOUND 1/2 X4 (GAUZE/BANDAGES/DRESSINGS) ×1
COVER WAND RF STERILE (DRAPES) ×3 IMPLANT
DECANTER SPIKE VIAL GLASS SM (MISCELLANEOUS) IMPLANT
DRAPE WARM FLUID 44X44 (DRAPES) IMPLANT
DRSG OPSITE POSTOP 4X10 (GAUZE/BANDAGES/DRESSINGS) ×3 IMPLANT
DURAPREP 26ML APPLICATOR (WOUND CARE) ×3 IMPLANT
GAUZE 4X4 16PLY RFD (DISPOSABLE) IMPLANT
GAUZE SPONGE 4X4 12PLY STRL LF (GAUZE/BANDAGES/DRESSINGS) ×3 IMPLANT
GAUZE SPONGE 4X4 16PLY XRAY LF (GAUZE/BANDAGES/DRESSINGS) ×2 IMPLANT
GLOVE BIOGEL PI IND STRL 7.0 (GLOVE) ×3 IMPLANT
GLOVE BIOGEL PI INDICATOR 7.0 (GLOVE) ×6
GLOVE ECLIPSE 7.0 STRL STRAW (GLOVE) ×3 IMPLANT
GOWN STRL REUS W/ TWL LRG LVL3 (GOWN DISPOSABLE) ×2 IMPLANT
GOWN STRL REUS W/TWL LRG LVL3 (GOWN DISPOSABLE) ×6
HEMOSTAT SURGICEL 4X8 (HEMOSTASIS) ×2 IMPLANT
HIBICLENS CHG 4% 4OZ BTL (MISCELLANEOUS) ×3 IMPLANT
KIT TURNOVER KIT B (KITS) ×3 IMPLANT
NEEDLE HYPO 22GX1.5 SAFETY (NEEDLE) ×3 IMPLANT
NS IRRIG 1000ML POUR BTL (IV SOLUTION) ×3 IMPLANT
PACK ABDOMINAL GYN (CUSTOM PROCEDURE TRAY) ×3 IMPLANT
PAD ABD 7.5X8 STRL (GAUZE/BANDAGES/DRESSINGS) ×2 IMPLANT
PAD ARMBOARD 7.5X6 YLW CONV (MISCELLANEOUS) ×6 IMPLANT
PAD OB MATERNITY 4.3X12.25 (PERSONAL CARE ITEMS) ×3 IMPLANT
RETRACTOR WND ALEXIS 18 MED (MISCELLANEOUS) IMPLANT
RTRCTR WOUND ALEXIS 18CM MED (MISCELLANEOUS)
SPECIMEN JAR MEDIUM (MISCELLANEOUS) ×3 IMPLANT
SPONGE LAP 18X18 RF (DISPOSABLE) ×6 IMPLANT
STAPLER VISISTAT 35W (STAPLE) IMPLANT
STRIP CLOSURE SKIN 1/2X4 (GAUZE/BANDAGES/DRESSINGS) ×2 IMPLANT
SUT PDS AB 0 CTX 60 (SUTURE) IMPLANT
SUT PLAIN 2 0 (SUTURE)
SUT PLAIN ABS 2-0 CT1 27XMFL (SUTURE) IMPLANT
SUT VIC AB 0 CT1 18XCR BRD8 (SUTURE) ×3 IMPLANT
SUT VIC AB 0 CT1 27 (SUTURE) ×3
SUT VIC AB 0 CT1 27XBRD ANBCTR (SUTURE) ×1 IMPLANT
SUT VIC AB 0 CT1 8-18 (SUTURE) ×6
SUT VIC AB 0 CTX 36 (SUTURE) ×3
SUT VIC AB 0 CTX36XBRD ANBCTRL (SUTURE) IMPLANT
SUT VIC AB 4-0 KS 27 (SUTURE) IMPLANT
SUT VICRYL 0 TIES 12 18 (SUTURE) ×3 IMPLANT
SYR CONTROL 10ML LL (SYRINGE) ×3 IMPLANT
TAPE PAPER 3X10 WHT MICROPORE (GAUZE/BANDAGES/DRESSINGS) ×2 IMPLANT
TOWEL GREEN STERILE FF (TOWEL DISPOSABLE) ×6 IMPLANT
TRAY FOLEY W/BAG SLVR 14FR (SET/KITS/TRAYS/PACK) ×3 IMPLANT

## 2019-09-04 NOTE — Anesthesia Procedure Notes (Signed)
Anesthesia Regional Block: Quadratus lumborum   Pre-Anesthetic Checklist: ,, timeout performed, Correct Patient, Correct Site, Correct Laterality, Correct Procedure, Correct Position, site marked, Risks and benefits discussed,  Surgical consent,  Pre-op evaluation,  At surgeon's request and post-op pain management  Laterality: Right  Prep: Maximum Sterile Barrier Precautions used, chloraprep       Needles:  Injection technique: Single-shot  Needle Type: Echogenic Stimulator Needle     Needle Length: 9cm  Needle Gauge: 22     Additional Needles:   Procedures:,,,, ultrasound used (permanent image in chart),,,,  Narrative:  Start time: 09/04/2019 2:10 PM End time: 09/04/2019 2:20 PM Injection made incrementally with aspirations every 5 mL.  Performed by: Personally  Anesthesiologist: Freddrick March, MD  Additional Notes: Monitors applied. No increased pain on injection. No increased resistance to injection. Injection made in 5cc increments. Good needle visualization. Patient tolerated procedure well.

## 2019-09-04 NOTE — Interval H&P Note (Signed)
   History and Physical Interval Note 09/04/2019 1:59 PM  Grace Robertson  has presented today for surgery, with the diagnosis of FIBROIDS, MENORRHAGIA.  The various methods of treatment have been discussed with the patient and family. After consideration of risks, benefits and other options for treatment, the patient has consented to  Procedure(s) with comments: HYSTERECTOMY ABDOMINAL WITH SALPINGECTOMY (Bilateral) - REQUESTING TAP BLOCK as a surgical intervention.  The patient's history has been reviewed, patient examined, no change in status, stable for surgery.  I have reviewed the patient's chart and labs.  Questions were answered to the patient's satisfaction.  To OR when ready.    Verita Schneiders, MD, [place] for [employer], [place]

## 2019-09-04 NOTE — Op Note (Signed)
Grace Robertson PROCEDURE DATE: 09/04/2019  PREOPERATIVE DIAGNOSES:  Symptomatic fibroids, abnormal uterine bleeding, chronic anemia due to blood loss POSTOPERATIVE DIAGNOSES:  The same SURGEON:  Dr. Verita Schneiders ASSISTANT:  Dr. Emeterio Reeve.  An experienced assistant was required given the standard of surgical care given the complexity of the case.  This assistant was needed for exposure, dissection, suctioning, retraction, instrument exchange, and for overall help during the procedure. OPERATION:  Total abdominal hysterectomy, Bilateral Salpingectomy ANESTHESIA:  General endotracheal.  INDICATIONS: The patient is a 39 y.o. G0P0000 with the aforementioned diagnoses who desires definitive surgical management. On the preoperative visit, the risks, benefits, indications, and alternatives of the procedure were reviewed with the patient.  On the day of surgery, the risks of surgery were again discussed with the patient including but not limited to: bleeding which may require transfusion or reoperation; infection which may require antibiotics; injury to bowel, bladder, ureters or other surrounding organs; need for additional procedures; thromboembolic phenomenon, incisional problems and other postoperative/anesthesia complications. Written informed consent was obtained.    OPERATIVE FINDINGS: A 20 week size uterus with normal tubes and ovaries bilaterally.  ESTIMATED BLOOD LOSS: 200 ml SPECIMENS:  Uterus,cervix,  bilateral fallopian tubes sent to pathology COMPLICATIONS:  None immediate.   DESCRIPTION OF PROCEDURE:  The patient received intravenous antibiotics and had sequential compression devices applied to her lower extremities while in the preoperative area.   She was taken to the operating room and placed under general anesthesia without difficulty.The abdomen and perineum were prepped and draped in a sterile manner, and she was placed in a dorsal supine position.  A Foley catheter was inserted  into the bladder and attached to constant drainage. After an adequate timeout was performed, a Pfannensteil skin incision was made. This incision was taken down to the fascia using electrocautery with care given to maintain good hemostasis. The fascia was incised in the midline and the fascial incision was then extended bilaterally using electrocautery without difficulty. The fascia was then dissected off the underlying rectus muscles using blunt and sharp dissection. The rectus muscles were split bluntly in the midline and the peritoneum entered sharply without complication. This peritoneal incision was then extended superiorly and inferiorly with care given to prevent bowel or bladder injury. Attention was then turned to the pelvis. A retractor was placed into the incision, and the bowel was packed away with moist laparotomy sponges. The uterus at this point was noted to be mobilized and was delivered up out of the abdomen.  The round ligaments on each side were clamped, suture ligated with 0 Vicryl, and transected with electrocautery allowing entry into the broad ligament. Of note, all sutures used in this procedure are 0 Vicryl unless otherwise noted. The anterior and posterior leaves of the broad ligament were separated, and the ureters were inspected to be safely away from the area of dissection bilaterally.  Adnexae were clamped on the patient's right side, cut, and doubly suture ligated. This procedure was repeated in an identical fashion on the left site allowing for both adnexa to remain in place.  Kelly clamps were placed on the mesosalpinx of the right fallopian tube, and the fallopian tube was excised.  The pedicle was then secured with a free tie.  A similar process was carried out on the left side, allowing for bilateral salpingectomy.   A bladder flap was then created.  The bladder was then bluntly dissected off the lower uterine segment and cervix with good hemostasis noted. The  uterine arteries  were then skeletonized bilaterally and then clamped, cut, and doubly suture ligated with care given to prevent ureteral injury.  The uterosacral ligaments were then clamped, cut, and ligated bilaterally.  Finally, the cardinal ligaments were clamped, cut, and ligated bilaterally.  Acutely curved clamps were placed across the vagina just under the cervix, and the specimen was amputated and sent to pathology. The vaginal cuff angles were closed with Heaney stiches with care given to incorporate the uterosacral-cardinal ligament pedicles on both sides. The middle of the vaginal cuff was closed with a series of interrupted figure-of-eight sutures with care given to incorporate the anterior pubocervical fascia and the posterior rectovaginal fascia.   The pelvis was irrigated and hemostasis was reconfirmed at all pedicles and along the pelvic sidewall.  The ureters were inspected and noted to be peristalsing bilaterally.  All laparotomy sponges and instruments were removed from the abdomen. The peritoneum was closed with a running stitch, and the fascia was also closed in a running fashion. The subcutaneous layer was reapproximated with 2-0 plain gut. The skin was closed with a 4-0 Vicryl subcuticular stitch. Sponge, lap, needle, and instrument counts were correct times two. The patient was taken to the recovery area awake, extubated and in stable condition.   Verita Schneiders, MD, [place] for [employer], [place]

## 2019-09-04 NOTE — Anesthesia Postprocedure Evaluation (Signed)
Anesthesia Post Note  Patient: Grace Robertson  Procedure(s) Performed: HYSTERECTOMY ABDOMINAL WITH SALPINGECTOMY (Bilateral Abdomen)     Patient location during evaluation: PACU Anesthesia Type: Regional and General Robertson of consciousness: sedated, patient cooperative and oriented Pain management: pain Robertson controlled Vital Signs Assessment: post-procedure vital signs reviewed and stable Respiratory status: spontaneous breathing, nonlabored ventilation and respiratory function stable Cardiovascular status: blood pressure returned to baseline and stable Postop Assessment: no apparent nausea or vomiting Anesthetic complications: no   No complications documented.  Last Vitals:  Vitals:   09/04/19 1702 09/04/19 1717  BP: (!) 147/102 (!) 156/108  Pulse: 83 85  Resp: 16 19  Temp:    SpO2: 98% 97%    Last Pain:  Vitals:   09/04/19 1717  TempSrc:   PainSc: Asleep                 Sweet,E. Latissa Frick

## 2019-09-04 NOTE — Anesthesia Procedure Notes (Signed)
Anesthesia Regional Block: Quadratus lumborum   Pre-Anesthetic Checklist: ,, timeout performed, Correct Patient, Correct Site, Correct Laterality, Correct Procedure, Correct Position, site marked, Risks and benefits discussed,  Surgical consent,  Pre-op evaluation,  At surgeon's request and post-op pain management  Laterality: Left  Prep: Maximum Sterile Barrier Precautions used, chloraprep       Needles:  Injection technique: Single-shot  Needle Type: Echogenic Stimulator Needle     Needle Length: 9cm  Needle Gauge: 22     Additional Needles:   Procedures:,,,, ultrasound used (permanent image in chart),,,,  Narrative:  Start time: 09/04/2019 2:00 PM End time: 09/04/2019 2:10 PM Injection made incrementally with aspirations every 5 mL.  Performed by: Personally  Anesthesiologist: Freddrick March, MD  Additional Notes: Monitors applied. No increased pain on injection. No increased resistance to injection. Injection made in 5cc increments. Good needle visualization. Patient tolerated procedure well.

## 2019-09-04 NOTE — Discharge Instructions (Signed)
Abdominal Hysterectomy, Care After This sheet gives you information about how to care for yourself after your procedure. Your health care provider may also give you more specific instructions. If you have problems or questions, contact your health care provider. What can I expect after the procedure? After your procedure, it is common to have:  Pain.  Fatigue.  Poor appetite.  Less interest in sex.  Vaginal bleeding and discharge. You may need to use a sanitary napkin after this procedure. Follow these instructions at home: Bathing  Do not take baths, swim, or use a hot tub until your health care provider approves. Ask your health care provider if you can take showers. You may only be allowed to take sponge baths for bathing.  Keep the bandage (dressing) dry until your health care provider says it can be removed. Incision care   Follow instructions from your health care provider about how to take care of your incision. Make sure you: ? Wash your hands with soap and water before you change your bandage (dressing). If soap and water are not available, use hand sanitizer. ? Change your dressing as told by your health care provider. ? Leave stitches (sutures), skin glue, or adhesive strips in place. These skin closures may need to stay in place for 2 weeks or longer. If adhesive strip edges start to loosen and curl up, you may trim the loose edges. Do not remove adhesive strips completely unless your health care provider tells you to do that.  Check your incision area every day for signs of infection. Check for: ? Redness, swelling, or pain. ? Fluid or blood. ? Warmth. ? Pus or a bad smell. Activity  Do gentle, daily exercises as told by your health care provider. You may be told to take short walks every day and go farther each time.  Do not lift anything that is heavier than 10 lb (4.5 kg), or the limit that your health care provider tells you, until he or she says that it is  safe.  Do not drive or use heavy machinery while taking prescription pain medicine.  Do not drive for 24 hours if you were given a medicine to help you relax (sedative).  Follow your health care provider's instructions about exercise, driving, and general activities. Ask your health care provider what activities are safe for you. Lifestyle  Do not douche, use tampons, or have sex for at least 6 weeks or as told by your health care provider.  Do not drink alcohol until your health care provider approves.  Drink enough fluid to keep your urine clear or pale yellow.  Try to have someone at home with you for the first 1-2 weeks to help.  Do not use any products that contain nicotine or tobacco, such as cigarettes and e-cigarettes. These can delay healing. If you need help quitting, ask your health care provider. General instructions  Take over-the-counter and prescription medicines only as told by your health care provider.  Do not take aspirin or ibuprofen. These medicines can cause bleeding.  To prevent or treat constipation while you are taking prescription pain medicine, your health care provider may recommend that you: ? Drink enough fluid to keep your urine clear or pale yellow. ? Take over-the-counter or prescription medicines. ? Eat foods that are high in fiber, such as fresh fruits and vegetables, whole grains, and beans. ? Limit foods that are high in fat and processed sugars, such as fried and sweet foods.  Keep all   follow-up visits as told by your health care provider. This is important. Contact a health care provider if:  You have chills or fever.  You have redness, swelling, or pain around your incision.  You have fluid or blood coming from your incision.  Your incision feels warm to the touch.  You have pus or a bad smell coming from your incision.  Your incision breaks open.  You feel dizzy or light-headed.  You have pain or bleeding when you urinate.  You  have persistent diarrhea.  You have persistent nausea and vomiting.  You have abnormal vaginal discharge.  You have a rash.  You have any type of abnormal reaction or you develop an allergy to your medicine.  Your pain medicine does not help. Get help right away if:  You have a fever and your symptoms suddenly get worse.  You have severe abdominal pain.  You have shortness of breath.  You faint.  You have pain, swelling, or redness in your leg.  You have heavy vaginal bleeding with blood clots. Summary  After your procedure, it is common to have pain, fatigue and vaginal discharge.  Do not take baths, swim, or use a hot tub until your health care provider approves. Ask your health care provider if you can take showers. You may only be allowed to take sponge baths for bathing.  Follow your health care provider's instructions about exercise, driving, and general activities. Ask your health care provider what activities are safe for you.  Do not lift anything that is heavier than 10 lb (4.5 kg), or the limit that your health care provider tells you, until he or she says that it is safe.  Try to have someone at home with you for the first 1-2 weeks to help. This information is not intended to replace advice given to you by your health care provider. Make sure you discuss any questions you have with your health care provider. Document Revised: 03/28/2018 Document Reviewed: 02/11/2016 Elsevier Patient Education  2020 Elsevier Inc.  

## 2019-09-04 NOTE — Transfer of Care (Signed)
Immediate Anesthesia Transfer of Care Note  Patient: Grace Robertson  Procedure(s) Performed: HYSTERECTOMY ABDOMINAL WITH SALPINGECTOMY (Bilateral Abdomen)  Patient Location: PACU  Anesthesia Type:GA combined with regional for post-op pain  Level of Consciousness: awake, alert  and drowsy  Airway & Oxygen Therapy: Patient Spontanous Breathing and Patient connected to face mask oxygen  Post-op Assessment: Report given to RN and Post -op Vital signs reviewed and stable  Post vital signs: Reviewed and stable  Last Vitals:  Vitals Value Taken Time  BP 137/101 09/04/19 1647  Temp    Pulse 92 09/04/19 1650  Resp 18 09/04/19 1650  SpO2 99 % 09/04/19 1650  Vitals shown include unvalidated device data.  Last Pain:  Vitals:   09/04/19 1420  TempSrc:   PainSc: 0-No pain      Patients Stated Pain Goal: 5 (70/78/67 [id number])  Complications: No complications documented.

## 2019-09-04 NOTE — Plan of Care (Signed)
  Problem: Education: Goal: Knowledge of General Education information will improve Description: Including pain rating scale, medication(s)/side effects and non-pharmacologic comfort measures Outcome: Progressing  Aeb pt verbalizes poc this evening Problem: Clinical Measurements: Goal: Diagnostic test results will improve Outcome: Progressing  Aeb no new s/s VSS  Problem: Coping: Goal: Level of anxiety will decrease Outcome: Progressing  Aeb pt denies discomfort or anxiety

## 2019-09-04 NOTE — Anesthesia Procedure Notes (Signed)
Procedure Name: Intubation Date/Time: 09/04/2019 2:39 PM Performed by: Alain Marion, CRNA Pre-anesthesia Checklist: Patient identified, Emergency Drugs available, Suction available and Patient being monitored Patient Re-evaluated:Patient Re-evaluated prior to induction Oxygen Delivery Method: Circle System Utilized Preoxygenation: Pre-oxygenation with 100% oxygen Induction Type: IV induction Ventilation: Mask ventilation without difficulty Laryngoscope Size: Miller and 2 Grade View: Grade I Tube type: Oral Tube size: 7.0 mm Number of attempts: 1 Airway Equipment and Method: Stylet and Oral airway Placement Confirmation: ETT inserted through vocal cords under direct vision,  positive ETCO2 and breath sounds checked- equal and bilateral Secured at: 21 cm Tube secured with: Tape Dental Injury: Teeth and Oropharynx as per pre-operative assessment

## 2019-09-05 ENCOUNTER — Encounter (HOSPITAL_COMMUNITY): Payer: Self-pay | Admitting: Obstetrics & Gynecology

## 2019-09-05 LAB — CBC
HCT: 33.6 % — ABNORMAL LOW (ref 36.0–46.0)
Hemoglobin: 10.1 g/dL — ABNORMAL LOW (ref 12.0–15.0)
MCH: 23.8 pg — ABNORMAL LOW (ref 26.0–34.0)
MCHC: 30.1 g/dL (ref 30.0–36.0)
MCV: 79.1 fL — ABNORMAL LOW (ref 80.0–100.0)
Platelets: 277 10*3/uL (ref 150–400)
RBC: 4.25 MIL/uL (ref 3.87–5.11)
RDW: 20.3 % — ABNORMAL HIGH (ref 11.5–15.5)
WBC: 8 10*3/uL (ref 4.0–10.5)
nRBC: 0 % (ref 0.0–0.2)

## 2019-09-05 LAB — BASIC METABOLIC PANEL
Anion gap: 9 (ref 5–15)
BUN: 9 mg/dL (ref 6–20)
CO2: 22 mmol/L (ref 22–32)
Calcium: 8.5 mg/dL — ABNORMAL LOW (ref 8.9–10.3)
Chloride: 101 mmol/L (ref 98–111)
Creatinine, Ser: 0.88 mg/dL (ref 0.44–1.00)
GFR calc Af Amer: >60 mL/min (ref >60)
GFR calc non Af Amer: >60 mL/min (ref >60)
Glucose, Bld: 135 mg/dL — ABNORMAL HIGH (ref 70–99)
Potassium: 4.3 mmol/L (ref 3.5–5.1)
Sodium: 132 mmol/L — ABNORMAL LOW (ref 135–145)

## 2019-09-05 MED ORDER — GABAPENTIN 100 MG PO CAPS: 100.0000 mg | ORAL_CAPSULE | Freq: Two times a day (BID) | ORAL | 0 refills | Status: DC

## 2019-09-05 MED ORDER — IBUPROFEN 800 MG PO TABS: 800.0000 mg | ORAL_TABLET | Freq: Three times a day (TID) | ORAL | 0 refills | Status: DC | PRN

## 2019-09-05 MED ORDER — DOCUSATE SODIUM 100 MG PO CAPS: 100.0000 mg | ORAL_CAPSULE | Freq: Two times a day (BID) | ORAL | 3 refills | Status: DC | PRN

## 2019-09-05 MED ORDER — OXYCODONE HCL 5 MG PO TABS: 5.0000 mg | ORAL_TABLET | ORAL | 0 refills | Status: DC | PRN

## 2019-09-05 NOTE — Progress Notes (Signed)
1 Day Post-Op Procedure(s) (LRB): HYSTERECTOMY ABDOMINAL WITH SALPINGECTOMY (Bilateral)  Subjective: Patient reports incisional pain and tolerating PO.  Foley discontinued. No flatus yet.  Minimal OOB so far.   Objective: I have reviewed patient's vital signs, intake and output, medications and labs.  Blood pressure 112/76, pulse 91, temperature 99 F (37.2 C), temperature source Oral, resp. rate 19, height 5\' 4"  (1.626 m), weight 86.5 kg, last menstrual period 08/24/2019, SpO2 100 %. General: alert and no distress Resp: normal breath sounds Cardio: regular rate GI: soft, non-tender; bowel sounds normal; no masses,  no organomegaly and incision: clean, dry and intact Extremities: extremities normal, atraumatic, no cyanosis or edema and Homans sign is negative, no sign of DVT Vaginal Bleeding: minimal  CBC Latest Ref Rng & Units 09/05/2019 08/31/2019 07/06/2019  WBC 4.0 - 10.5 K/uL 8.0 3.0(L) 2.5(L)  Hemoglobin 12.0 - 15.0 g/dL 10.1(L) 12.0 11.3(L)  Hematocrit 36 - 46 % 33.6(L) 41.6 38.9  Platelets 150 - 400 K/uL 277 332 281    Assessment: s/p Procedure(s) with comments: HYSTERECTOMY ABDOMINAL WITH SALPINGECTOMY (Bilateral) - REQUESTING TAP BLOCK: stable and progressing well  Plan: Advance diet Encourage ambulation Advance to PO medication Discharge home later today vs tomorrow morning if remains stable  LOS: 1 day    Grace Schneiders, MD 09/05/2019, 8:01 AM

## 2019-09-05 NOTE — Discharge Summary (Signed)
Gynecology Physician Postoperative Discharge Summary  Patient ID: Grace Robertson MRN: [id number] DOB/AGE: 09-Jan-1981 39 y.o.  Admit Date: 09/04/2019 Discharge Date: 09/05/2019  Preoperative Diagnoses: Symptomatic uterine fibroids, abnormal uterine bleeding, chronic anemia due to blood loss  Procedures: Procedure(s) (LRB): HYSTERECTOMY ABDOMINAL WITH SALPINGECTOMY (Bilateral)  Hospital Course:  Grace Robertson is a 39 y.o. G0 admitted for scheduled surgery.  She underwent the procedures as mentioned above, her operation was uncomplicated. For further details about surgery, please refer to the operative report. Patient had an uncomplicated postoperative course. By time of discharge on POD#1, her pain was controlled on oral pain medications; she was ambulating, voiding without difficulty, tolerating regular diet and passing flatus.   She wanted to go home, and she was deemed stable for early discharge to home.   Significant Labs: CBC Latest Ref Rng & Units 09/05/2019 08/31/2019 07/06/2019  WBC 4.0 - 10.5 K/uL 8.0 3.0(L) 2.5(L)  Hemoglobin 12.0 - 15.0 g/dL 10.1(L) 12.0 11.3(L)  Hematocrit 36 - 46 % 33.6(L) 41.6 38.9  Platelets 150 - 400 K/uL 277 332 281    Discharge Exam: Blood pressure 112/70, pulse 85, temperature 98.3 F (36.8 C), temperature source Oral, resp. rate 17, height 5\' 4"  (1.626 m), weight 86.5 kg, last menstrual period 08/24/2019, SpO2 100 %. General appearance: alert and no distress  Resp: clear to auscultation bilaterally  Cardio: regular rate and rhythm  GI: soft, non-tender; bowel sounds normal; no masses, no organomegaly.  Incision: C/D/I, no erythema, no drainage noted Pelvic: scant blood on pad  Extremities: extremities normal, atraumatic, no cyanosis or edema and Homans sign is negative, no sign of DVT  Discharged Condition: Stable  Disposition: Discharge disposition: 01-Home or Self Care        Allergies as of 09/05/2019   No Known Allergies      Medication List    TAKE these medications   docusate sodium 100 MG capsule Commonly known as: COLACE Take 1 capsule (100 mg total) by mouth 2 (two) times daily as needed for mild constipation or moderate constipation.   gabapentin 100 MG capsule Commonly known as: NEURONTIN Take 1 capsule (100 mg total) by mouth 2 (two) times daily.   ibuprofen 800 MG tablet Commonly known as: ADVIL Take 1 tablet (800 mg total) by mouth 3 (three) times daily with meals as needed for headache, mild pain, moderate pain or cramping.   oxyCODONE 5 MG immediate release tablet Commonly known as: Oxy IR/ROXICODONE Take 1 tablet (5 mg total) by mouth every 4 (four) hours as needed for moderate pain or severe pain.      Future Appointments  Date Time Provider [place]  09/17/2019 10:45 AM Sloan Leiter, MD [facility] [place]  10/05/2019 11:00 AM [facility] None  10/05/2019 11:15 AM [place], Holli Humbles, NP [facility] None  10/15/2019 10:15 AM Sloan Leiter, MD [facility]     Signed:  Verita Schneiders, MD, [place] Attending [place], [facility]

## 2019-09-05 NOTE — Progress Notes (Signed)
AVS given and reviewed with pt. Medications discussed. Abdominal binder provided to pt per request. All questions answered to satisfaction. Pt verbalized understanding of information given. Pt to be escorted off the unit with all belongings via wheelchair by staff member.

## 2019-09-05 NOTE — Plan of Care (Signed)

## 2019-09-06 ENCOUNTER — Encounter: Payer: Self-pay | Admitting: Obstetrics & Gynecology

## 2019-09-06 LAB — SURGICAL PATHOLOGY

## 2019-09-12 ENCOUNTER — Telehealth: Payer: Self-pay

## 2019-09-12 NOTE — Telephone Encounter (Signed)
Patient contacted about wellness forms that are ready for pick up. Patient needs labs the order are in Per Dr. Sheppard Coil. A message was left on patients voicemail to contact office if she would like forms to be faxed.

## 2019-09-14 ENCOUNTER — Other Ambulatory Visit: Payer: Managed Care, Other (non HMO)

## 2019-09-14 ENCOUNTER — Ambulatory Visit: Payer: Managed Care, Other (non HMO) | Admitting: Family

## 2019-09-17 ENCOUNTER — Encounter: Payer: Self-pay | Admitting: Obstetrics and Gynecology

## 2019-09-17 ENCOUNTER — Other Ambulatory Visit: Payer: Self-pay

## 2019-09-17 ENCOUNTER — Ambulatory Visit (INDEPENDENT_AMBULATORY_CARE_PROVIDER_SITE_OTHER): Payer: Managed Care, Other (non HMO) | Admitting: Obstetrics and Gynecology

## 2019-09-17 VITALS — BP 138/91 | HR 91 | Resp 16 | Ht 64.0 in | Wt 187.0 lb

## 2019-09-17 DIAGNOSIS — Z48816 Encounter for surgical aftercare following surgery on the genitourinary system: Secondary | ICD-10-CM

## 2019-09-17 DIAGNOSIS — Z9889 Other specified postprocedural states: Secondary | ICD-10-CM

## 2019-09-17 NOTE — Progress Notes (Signed)
GYNECOLOGY OFFICE FOLLOW UP NOTE  History:  39 y.o. G0P0000 here today for follow up for total abdominal hysterectomy, bilateral salpingectomy on 09/04/19.  She is doing well. Minimal soreness at site, no pain. Denies issues with urination or bowel movements, no nausea/vomiting. Doing daily activities without issue. Has not had vaginal bleeding since last week. Overall, feeling well.  Past Medical History:  Diagnosis Date   Heart murmur    as baby   Injury of fifth finger, left 08/14/2013   Iron deficiency anemia due to chronic blood loss     Past Surgical History:  Procedure Laterality Date   HYSTERECTOMY ABDOMINAL WITH SALPINGECTOMY Bilateral 09/04/2019   Procedure: HYSTERECTOMY ABDOMINAL WITH SALPINGECTOMY;  Surgeon: Osborne Oman, MD;  Location: [facility];  Service: Gynecology;  Laterality: Bilateral;  REQUESTING TAP BLOCK   OPEN REDUCTION INTERNAL FIXATION (ORIF) METACARPAL Left 08/15/2013   Procedure: OPEN REDUCTION INTERNAL FIXATION (ORIF) LEFT SMALL FINGER;  Surgeon: Jolyn Nap, MD;  Location: [facility];  Service: Orthopedics;  Laterality: Left;     Current Outpatient Medications:    docusate sodium (COLACE) 100 MG capsule, Take 1 capsule (100 mg total) by mouth 2 (two) times daily as needed for mild constipation or moderate constipation., Disp: 30 capsule, Rfl: 3   gabapentin (NEURONTIN) 100 MG capsule, Take 1 capsule (100 mg total) by mouth 2 (two) times daily., Disp: 14 capsule, Rfl: 0   ibuprofen (ADVIL) 800 MG tablet, Take 1 tablet (800 mg total) by mouth 3 (three) times daily with meals as needed for headache, mild pain, moderate pain or cramping., Disp: 30 tablet, Rfl: 0  The following portions of the patient's history were reviewed and updated as appropriate: allergies, current medications, past family history, past medical history, past social history, past surgical history and problem list.   Review of Systems:  Pertinent items noted in HPI  and remainder of comprehensive ROS otherwise negative.   Objective:  Physical Exam BP (!) 138/91    Pulse 91    Resp 16    Ht 5\' 4"  (1.626 m)    Wt 187 lb (84.8 kg)    LMP 08/24/2019    BMI 32.10 kg/m  CONSTITUTIONAL: Well-developed, well-nourished female in no acute distress.  HENT:  Normocephalic, atraumatic. External right and left ear normal. Oropharynx is clear and moist EYES: Conjunctivae and EOM are normal. Pupils are equal, round, and reactive to light. No scleral icterus.  NECK: Normal range of motion, supple, no masses SKIN: Skin is warm and dry. No rash noted. Not diaphoretic. No erythema. No pallor. NEUROLOGIC: Alert and oriented to person, place, and time. Normal reflexes, muscle tone coordination. No cranial nerve deficit noted. PSYCHIATRIC: Normal mood and affect. Normal behavior. Normal judgment and thought content. CARDIOVASCULAR: Normal heart rate noted RESPIRATORY: Effort normal, no problems with respiration noted ABDOMEN: Soft, no distention noted. Incision intact, well approximated, no areas of separation or erythema   PELVIC: defered MUSCULOSKELETAL: Normal range of motion. No edema noted.  Labs and Imaging No results found.  Assessment & Plan:   1. Postoperative state Doing very well No issues Cuff exam next visit   Routine preventative health maintenance measures emphasized. Please refer to After Visit Summary for other counseling recommendations.   Return in about 4 weeks (around 10/15/2019) for as scheduled.  Total face-to-face time with patient: 10 minutes. Over 50% of encounter was spent on counseling and coordination of care.  Feliz Beam, M.D. Attending Center for [employer] (  Water engineer)

## 2019-10-05 ENCOUNTER — Encounter: Payer: Self-pay | Admitting: Family

## 2019-10-05 ENCOUNTER — Other Ambulatory Visit: Payer: Self-pay

## 2019-10-05 ENCOUNTER — Inpatient Hospital Stay (HOSPITAL_BASED_OUTPATIENT_CLINIC_OR_DEPARTMENT_OTHER): Payer: Managed Care, Other (non HMO) | Admitting: Family

## 2019-10-05 ENCOUNTER — Inpatient Hospital Stay: Payer: Managed Care, Other (non HMO) | Attending: Family

## 2019-10-05 VITALS — BP 138/98 | HR 90 | Temp 98.2°F | Resp 16 | Ht 64.0 in | Wt 188.1 lb

## 2019-10-05 DIAGNOSIS — D500 Iron deficiency anemia secondary to blood loss (chronic): Secondary | ICD-10-CM | POA: Diagnosis not present

## 2019-10-05 DIAGNOSIS — N920 Excessive and frequent menstruation with regular cycle: Secondary | ICD-10-CM | POA: Insufficient documentation

## 2019-10-05 DIAGNOSIS — D560 Alpha thalassemia: Secondary | ICD-10-CM | POA: Diagnosis not present

## 2019-10-05 DIAGNOSIS — D509 Iron deficiency anemia, unspecified: Secondary | ICD-10-CM

## 2019-10-05 LAB — CBC WITH DIFFERENTIAL (CANCER CENTER ONLY)
Abs Immature Granulocytes: 0.02 10*3/uL (ref 0.00–0.07)
Basophils Absolute: 0 10*3/uL (ref 0.0–0.1)
Basophils Relative: 1 %
Eosinophils Absolute: 0.1 10*3/uL (ref 0.0–0.5)
Eosinophils Relative: 2 %
HCT: 43.2 % (ref 36.0–46.0)
Hemoglobin: 13.2 g/dL (ref 12.0–15.0)
Immature Granulocytes: 1 %
Lymphocytes Relative: 45 %
Lymphs Abs: 1.4 10*3/uL (ref 0.7–4.0)
MCH: 25.7 pg — ABNORMAL LOW (ref 26.0–34.0)
MCHC: 30.6 g/dL (ref 30.0–36.0)
MCV: 84 fL (ref 80.0–100.0)
Monocytes Absolute: 0.4 10*3/uL (ref 0.1–1.0)
Monocytes Relative: 14 %
Neutro Abs: 1.1 10*3/uL — ABNORMAL LOW (ref 1.7–7.7)
Neutrophils Relative %: 37 %
Platelet Count: 273 10*3/uL (ref 150–400)
RBC: 5.14 MIL/uL — ABNORMAL HIGH (ref 3.87–5.11)
RDW: 17.2 % — ABNORMAL HIGH (ref 11.5–15.5)
WBC Count: 3 10*3/uL — ABNORMAL LOW (ref 4.0–10.5)
nRBC: 0 % (ref 0.0–0.2)

## 2019-10-05 LAB — RETICULOCYTES
Immature Retic Fract: 4.9 % (ref 2.3–15.9)
RBC.: 5.18 MIL/uL — ABNORMAL HIGH (ref 3.87–5.11)
Retic Count, Absolute: 47.1 10*3/uL (ref 19.0–186.0)
Retic Ct Pct: 0.9 % (ref 0.4–3.1)

## 2019-10-05 NOTE — Progress Notes (Signed)
Hematology and Oncology Follow Up Visit  Grace Robertson [id number] January 24, 1981 39 y.o. 10/05/2019   Principle Diagnosis:  Iron deficiency anemia secondary to heavy cycle Alpha Thalassemia   Current Therapy: IV iron as indicated   Interim History:  Grace Robertson is here today for follow-up. She is doing quite well and has no complaints at this time. Hgb is now 13.2, MCV 84.  She denies fatigue and is no longer craving ice.  She has not noted any blood loss. No bruising or petechiae.  She had a hysterectomy in June and has done well.  No fever, chills, n/v, cough, rash, dizziness, SOB, chest pain, palpitations, abdominal pain or changes in bowel or bladder habits.  No swelling, tenderness, numbness or tingling in her extremities.  No falls or syncope.  She has maintained a good appetite and is staying well hydrated. Her weight is stable.   ECOG Performance Status: 1 - Symptomatic but completely ambulatory  Medications:  Allergies as of 10/05/2019   No Known Allergies     Medication List       Accurate as of October 05, 2019 11:14 AM. If you have any questions, ask your nurse or doctor.        STOP taking these medications   docusate sodium 100 MG capsule Commonly known as: COLACE Stopped by: Laverna Peace, NP   gabapentin 100 MG capsule Commonly known as: NEURONTIN Stopped by: Laverna Peace, NP   ibuprofen 800 MG tablet Commonly known as: ADVIL Stopped by: Laverna Peace, NP       Allergies: No Known Allergies  Past Medical History, Surgical history, Social history, and Family History were reviewed and updated.  Review of Systems: All other 10 point review of systems is negative.   Physical Exam:  height is 5\' 4"  (1.626 m) and weight is 188 lb 1.9 oz (85.3 kg). Her oral temperature is 98.2 F (36.8 C). Her blood pressure is 138/98 (abnormal) and her pulse is 90. Her respiration is 16 and oxygen saturation is 100%.   Wt Readings from Last 3  Encounters:  10/05/19 188 lb 1.9 oz (85.3 kg)  09/17/19 187 lb (84.8 kg)  09/04/19 190 lb 11.2 oz (86.5 kg)    Ocular: Sclerae unicteric, pupils equal, round and reactive to light Ear-nose-throat: Oropharynx clear, dentition fair Lymphatic: No cervical or supraclavicular adenopathy Lungs no rales or rhonchi, good excursion bilaterally Heart regular rate and rhythm, no murmur appreciated Abd soft, nontender, positive bowel sounds, no liver or spleen tip palpated on exam, no fluid wave  MSK no focal spinal tenderness, no joint edema Neuro: non-focal, well-oriented, appropriate affect Breasts: Deferred   Lab Results  Component Value Date   WBC 3.0 (L) 10/05/2019   HGB 13.2 10/05/2019   HCT 43.2 10/05/2019   MCV 84.0 10/05/2019   PLT 273 10/05/2019   Lab Results  Component Value Date   FERRITIN 9 (L) 07/06/2019   IRON 45 07/06/2019   TIBC 355 07/06/2019   UIBC 310 07/06/2019   IRONPCTSAT 13 (L) 07/06/2019   Lab Results  Component Value Date   RETICCTPCT 0.9 10/05/2019   RBC 5.14 (H) 10/05/2019   RBC 5.18 (H) 10/05/2019   No results found for: KPAFRELGTCHN, LAMBDASER, KAPLAMBRATIO No results found for: IGGSERUM, IGA, IGMSERUM No results found for: TOTALPROTELP, ALBUMINELP, A1GS, A2GS, BETS, BETA2SER, GAMS, MSPIKE, SPEI   Chemistry      Component Value Date/Time   NA 132 (L) 09/05/2019 0253   K 4.3 09/05/2019 0253  CL 101 09/05/2019 0253   CO2 22 09/05/2019 0253   BUN 9 09/05/2019 0253   CREATININE 0.88 09/05/2019 0253      Component Value Date/Time   CALCIUM 8.5 (L) 09/05/2019 0253       Impression and Plan: Ms. Round is a very pleasant38 yoAfrican American female with history of iron deficiency anemia secondary to heavy cycles.  We will see what her iron studies look like and replace if needed.  We will see her again in another 6 months for follow-up. If everything is stable at that times we will follow-up only as needed.  She can contact our office with  any questions or concerns. We can certainly see her sooner if needed.   Laverna Peace, NP 7/30/202111:14 AM

## 2019-10-08 LAB — FERRITIN: Ferritin: 56 ng/mL (ref 11–307)

## 2019-10-08 LAB — IRON AND TIBC
Iron: 104 ug/dL (ref 41–142)
Saturation Ratios: 31 % (ref 21–57)
TIBC: 333 ug/dL (ref 236–444)
UIBC: 229 ug/dL (ref 120–384)

## 2019-10-15 ENCOUNTER — Ambulatory Visit (INDEPENDENT_AMBULATORY_CARE_PROVIDER_SITE_OTHER): Payer: Managed Care, Other (non HMO) | Admitting: Obstetrics and Gynecology

## 2019-10-15 ENCOUNTER — Other Ambulatory Visit: Payer: Self-pay

## 2019-10-15 ENCOUNTER — Encounter: Payer: Self-pay | Admitting: Obstetrics and Gynecology

## 2019-10-15 VITALS — BP 155/104 | HR 84 | Resp 16 | Ht 64.0 in | Wt 189.0 lb

## 2019-10-15 DIAGNOSIS — Z90710 Acquired absence of both cervix and uterus: Secondary | ICD-10-CM

## 2019-10-15 DIAGNOSIS — Z9889 Other specified postprocedural states: Secondary | ICD-10-CM

## 2019-10-15 NOTE — Progress Notes (Signed)
BP recheck is still elevated at 155/104 P-73.  Pt denies headaches or visual changes.  Per Dr Rosana Hoes she is to f/u with her PCP regarding her BP

## 2019-10-15 NOTE — Progress Notes (Signed)
° °  GYNECOLOGY OFFICE FOLLOW UP NOTE  History:  39 y.o. G0P0000 here today for follow up for total abdominal hysterectomy, bilateral salpingectomy on 09/04/19. She is doing very well, no further pain. Scant dark bleeding at one point that self resolved. Denies issues with eating, stooling. Overall, doing well.   Past Medical History:  Diagnosis Date   Heart murmur    as baby   Injury of fifth finger, left 08/14/2013   Iron deficiency anemia due to chronic blood loss     Past Surgical History:  Procedure Laterality Date   HYSTERECTOMY ABDOMINAL WITH SALPINGECTOMY Bilateral 09/04/2019   Procedure: HYSTERECTOMY ABDOMINAL WITH SALPINGECTOMY;  Surgeon: Osborne Oman, MD;  Location: [facility];  Service: Gynecology;  Laterality: Bilateral;  REQUESTING TAP BLOCK   OPEN REDUCTION INTERNAL FIXATION (ORIF) METACARPAL Left 08/15/2013   Procedure: OPEN REDUCTION INTERNAL FIXATION (ORIF) LEFT SMALL FINGER;  Surgeon: Jolyn Nap, MD;  Location: [facility];  Service: Orthopedics;  Laterality: Left;    No current outpatient medications on file.  The following portions of the patient's history were reviewed and updated as appropriate: allergies, current medications, past family history, past medical history, past social history, past surgical history and problem list.   Review of Systems:  Pertinent items noted in HPI and remainder of comprehensive ROS otherwise negative.   Objective:  Physical Exam BP (!) 152/110    Pulse 84    Resp 16    Ht 5\' 4"  (1.626 m)    Wt 189 lb (85.7 kg)    LMP 08/24/2019    BMI 32.44 kg/m  CONSTITUTIONAL: Well-developed, well-nourished female in no acute distress.  HENT:  Normocephalic, atraumatic. External right and left ear normal. Oropharynx is clear and moist EYES: Conjunctivae and EOM are normal. Pupils are equal, round, and reactive to light. No scleral icterus.  NECK: Normal range of motion, supple, no masses SKIN: Skin is warm and dry. No rash  noted. Not diaphoretic. No erythema. No pallor. NEUROLOGIC: Alert and oriented to person, place, and time. Normal reflexes, muscle tone coordination. No cranial nerve deficit noted. PSYCHIATRIC: Normal mood and affect. Normal behavior. Normal judgment and thought content. CARDIOVASCULAR: Normal heart rate noted RESPIRATORY: Effort normal, no problems with respiration noted ABDOMEN: Soft, no distention noted.   PELVIC: Normal appearing external genitalia; normal appearing vaginal mucosa, cuff well healed with scant dark red blood in vault.  No abnormal discharge noted.  Uterus surgically absent, no other palpable masses, no uterine or adnexal tenderness. Cuff palpates intact MUSCULOSKELETAL: Normal range of motion. No edema noted.  Exam done with chaperone present.  Labs and Imaging No results found.  Assessment & Plan:   1. Post-operative state Doing well Cleared for return to work at 6 weeks  2. S/P hysterectomy Nothing in vagina until 8 weeks out due to bleeding at cuff Palpates intact, likely stitches dissolving  Routine preventative health maintenance measures emphasized. Please refer to After Visit Summary for other counseling recommendations.   Return in about 1 year (around 10/14/2020), or if symptoms worsen or fail to improve, for annual.  Total face-to-face time with patient: 15 minutes. Over 50% of encounter was spent on counseling and coordination of care.  Feliz Beam, M.D. Attending Center for [employer] Fish farm manager)

## 2019-10-15 NOTE — Patient Instructions (Signed)
-   You may shower after your surgery. When you shower, let water run over your incisions and pat dry. Do not take a bath or go swimming until you are cleared by your doctor. - A small amount of bleeding or oozing from the incision is normal, if you have a lot or if you are concerned about it, please call the office.  - Do not do any heavy lifting or major activity for at least 4 weeks after your surgery. You should be getting up and moving around the house with daily activities but do not strain yourself. - Do not put ANYTHING in the vagina until cleared by your doctor. This includes tampons, intercourse, douching, fingers, toys, etc. You need time to heal from the surgery. - Some spotting is normal even up to a few weeks after the surgery. If you have bleeding or foul smelling discharge, please call your doctor.   IF YOU HAVE ANY QUESTIONS OR CONCERNS, PLEASE CALL THE OFFICE.

## 2020-04-07 ENCOUNTER — Inpatient Hospital Stay: Payer: Managed Care, Other (non HMO) | Attending: Family | Admitting: Family

## 2020-04-07 ENCOUNTER — Inpatient Hospital Stay: Payer: Managed Care, Other (non HMO)

## 2020-04-07 ENCOUNTER — Telehealth: Payer: Self-pay | Admitting: Family

## 2020-04-07 ENCOUNTER — Encounter: Payer: Self-pay | Admitting: Family

## 2020-04-07 ENCOUNTER — Other Ambulatory Visit: Payer: Self-pay

## 2020-04-07 VITALS — BP 138/99 | HR 90 | Temp 97.8°F | Resp 18 | Ht 64.0 in | Wt 211.0 lb

## 2020-04-07 DIAGNOSIS — D500 Iron deficiency anemia secondary to blood loss (chronic): Secondary | ICD-10-CM | POA: Diagnosis not present

## 2020-04-07 DIAGNOSIS — N920 Excessive and frequent menstruation with regular cycle: Secondary | ICD-10-CM | POA: Insufficient documentation

## 2020-04-07 DIAGNOSIS — I10 Essential (primary) hypertension: Secondary | ICD-10-CM | POA: Insufficient documentation

## 2020-04-07 DIAGNOSIS — D509 Iron deficiency anemia, unspecified: Secondary | ICD-10-CM

## 2020-04-07 DIAGNOSIS — D560 Alpha thalassemia: Secondary | ICD-10-CM | POA: Insufficient documentation

## 2020-04-07 LAB — CBC WITH DIFFERENTIAL (CANCER CENTER ONLY)
Abs Immature Granulocytes: 0 10*3/uL (ref 0.00–0.07)
Basophils Absolute: 0 10*3/uL (ref 0.0–0.1)
Basophils Relative: 1 %
Eosinophils Absolute: 0.1 10*3/uL (ref 0.0–0.5)
Eosinophils Relative: 2 %
HCT: 44.1 % (ref 36.0–46.0)
Hemoglobin: 14.6 g/dL (ref 12.0–15.0)
Immature Granulocytes: 0 %
Lymphocytes Relative: 46 %
Lymphs Abs: 1.9 10*3/uL (ref 0.7–4.0)
MCH: 27.6 pg (ref 26.0–34.0)
MCHC: 33.1 g/dL (ref 30.0–36.0)
MCV: 83.4 fL (ref 80.0–100.0)
Monocytes Absolute: 0.4 10*3/uL (ref 0.1–1.0)
Monocytes Relative: 11 %
Neutro Abs: 1.6 10*3/uL — ABNORMAL LOW (ref 1.7–7.7)
Neutrophils Relative %: 40 %
Platelet Count: 233 10*3/uL (ref 150–400)
RBC: 5.29 MIL/uL — ABNORMAL HIGH (ref 3.87–5.11)
RDW: 12.2 % (ref 11.5–15.5)
WBC Count: 4 10*3/uL (ref 4.0–10.5)
nRBC: 0 % (ref 0.0–0.2)

## 2020-04-07 LAB — IRON AND TIBC
Iron: 103 ug/dL (ref 41–142)
Saturation Ratios: 34 % (ref 21–57)
TIBC: 306 ug/dL (ref 236–444)
UIBC: 203 ug/dL (ref 120–384)

## 2020-04-07 LAB — RETICULOCYTES
Immature Retic Fract: 7.4 % (ref 2.3–15.9)
RBC.: 5.28 MIL/uL — ABNORMAL HIGH (ref 3.87–5.11)
Retic Count, Absolute: 61.2 10*3/uL (ref 19.0–186.0)
Retic Ct Pct: 1.2 % (ref 0.4–3.1)

## 2020-04-07 LAB — FERRITIN: Ferritin: 34 ng/mL (ref 11–307)

## 2020-04-07 NOTE — Progress Notes (Signed)
Hematology and Oncology Follow Up Visit  Grace Robertson [id number] 03/05/1981 40 y.o. 04/07/2020   Principle Diagnosis:  Iron deficiency anemia secondary to heavy cycle Alpha Thalassemia  Current Therapy: IV iron as indicated Folic acid - not taking per patient   Interim History:  Grace Robertson is here today for follow-up. She is doing well but has had issues with high blood pressure. Her insurance changed and she is looking for a new PCP for management.  She notes fatigue, dizziness and headaches at times which she feels are likely due to high blood pressure. No fever, chills, n/v, cough, rash, SOB, chest pain, palpitations, abdominal pain or changes in bowel or bladder habits.  No swelling, tenderness, numbness or tingling in her extremities.  No falls or syncope to report.  She has maintained a good appetite and is staying well hydrated. Grace Robertson weight is stable.   ECOG Performance Status: 0 - Asymptomatic  Medications:  Allergies as of 04/07/2020   No Known Allergies     Medication List    as of April 07, 2020  9:10 AM   You have not been prescribed any medications.     Allergies: No Known Allergies  Past Medical History, Surgical history, Social history, and Family History were reviewed and updated.  Review of Systems: All other 10 point review of systems is negative.   Physical Exam:  vitals were not taken for this visit.   Wt Readings from Last 3 Encounters:  10/15/19 189 lb (85.7 kg)  10/05/19 188 lb 1.9 oz (85.3 kg)  09/17/19 187 lb (84.8 kg)    Ocular: Sclerae unicteric, pupils equal, round and reactive to light Ear-nose-throat: Oropharynx clear, dentition fair Lymphatic: No cervical or supraclavicular adenopathy Lungs no rales or rhonchi, good excursion bilaterally Heart regular rate and rhythm, no murmur appreciated Abd soft, nontender, positive bowel sounds MSK no focal spinal tenderness, no joint edema Neuro: non-focal, well-oriented,  appropriate affect Breasts: Deferred   Lab Results  Component Value Date   WBC 4.0 04/07/2020   HGB 14.6 04/07/2020   HCT 44.1 04/07/2020   MCV 83.4 04/07/2020   PLT 233 04/07/2020   Lab Results  Component Value Date   FERRITIN 56 10/05/2019   IRON 104 10/05/2019   TIBC 333 10/05/2019   UIBC 229 10/05/2019   IRONPCTSAT 31 10/05/2019   Lab Results  Component Value Date   RETICCTPCT 1.2 04/07/2020   RBC 5.29 (H) 04/07/2020   No results found for: KPAFRELGTCHN, LAMBDASER, KAPLAMBRATIO No results found for: IGGSERUM, IGA, IGMSERUM No results found for: Ronnald Ramp, A1GS, Gilford Silvius, MSPIKE, SPEI   Chemistry      Component Value Date/Time   NA 132 (L) 09/05/2019 0253   K 4.3 09/05/2019 0253   CL 101 09/05/2019 0253   CO2 22 09/05/2019 0253   BUN 9 09/05/2019 0253   CREATININE 0.88 09/05/2019 0253      Component Value Date/Time   CALCIUM 8.5 (L) 09/05/2019 0253       Impression and Plan: Grace Robertson is a very pleasant39 yoAfrican American female with history of alpha thalassemia as well as iron deficiency anemia secondary to heavy cycles.She had a hysterectomy in June 2021 and this seems to have helped resolve her anemia.  She continues to do well but was encouraged to establish care with a PCP for management of HTN. Hgb is now 14.6, MCV 83 and platelets 233.  We will follow up as needed.  She was encouraged to  contact our office with any questions or concerns.   Laverna Peace, NP 1/31/20229:10 AM

## 2020-04-07 NOTE — Telephone Encounter (Signed)
Follow-up as needed. Per 1/31 los

## 2020-10-18 IMAGING — US US TRANSVAGINAL NON-OB
1 series · 13 of 25 positions shown · non-contrast
Comparison: None

CLINICAL DATA: Enlarged uterus on exam, anemia, menometrorrhagia

EXAM:
TRANSABDOMINAL AND TRANSVAGINAL ULTRASOUND OF PELVIS
TECHNIQUE: Both transabdominal and transvaginal ultrasound examinations of the
pelvis were performed. Transabdominal technique was performed for
global imaging of the pelvis including uterus, ovaries, adnexal
regions, and pelvic cul-de-sac. It was necessary to proceed with
endovaginal exam following the transabdominal exam to visualize the
endometrium and ovaries.

[Series 1: us transvaginal non-ob · 0.18mm/px · 13 of 93 slices shown]
[im 1/93]
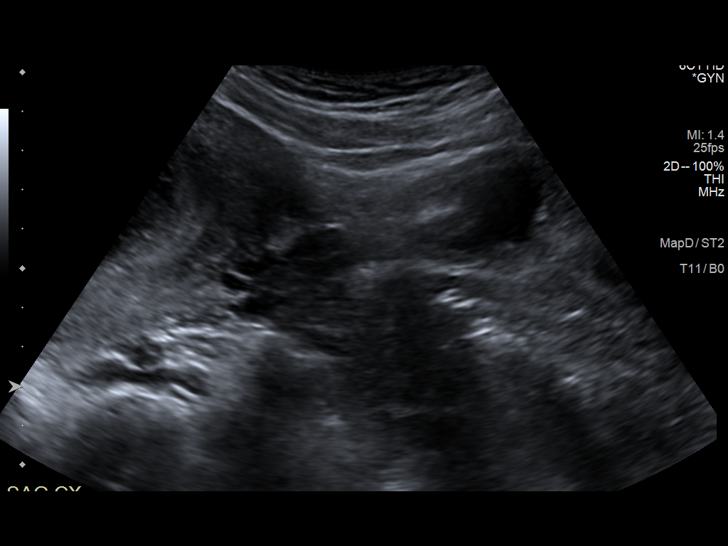
[im 8/93]
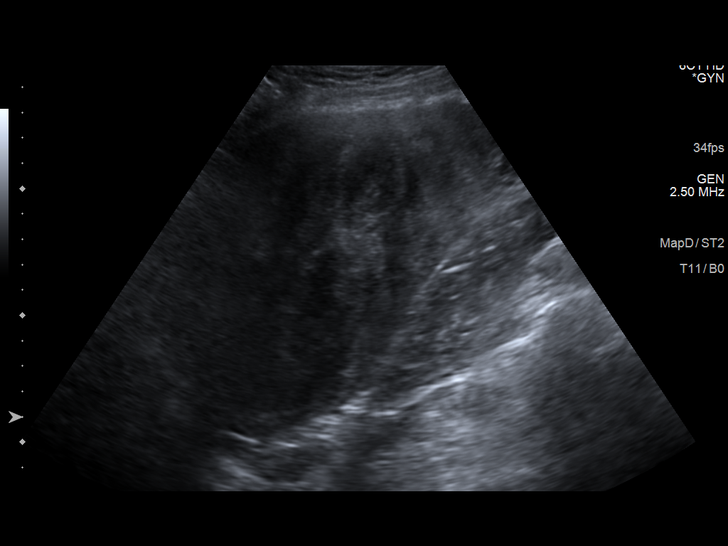
[im 16/93]
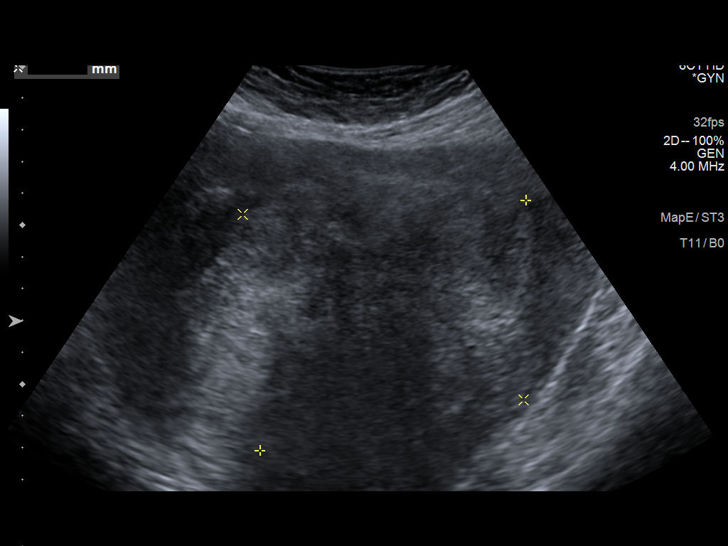
[im 24/93]
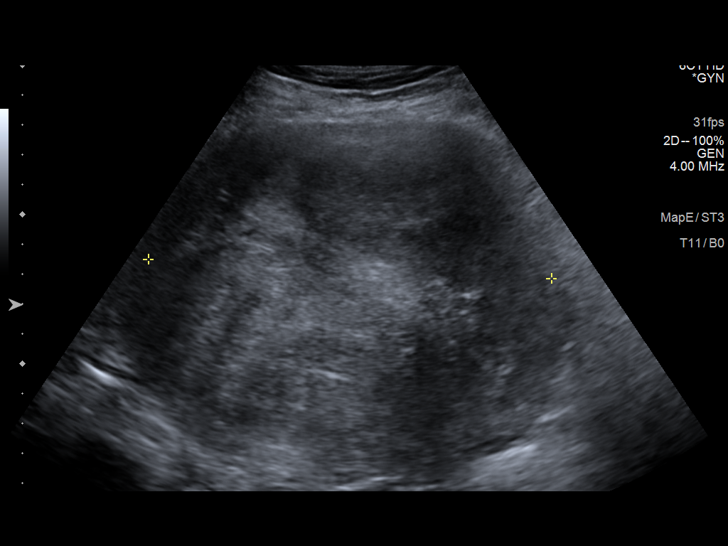
[im 31/93]
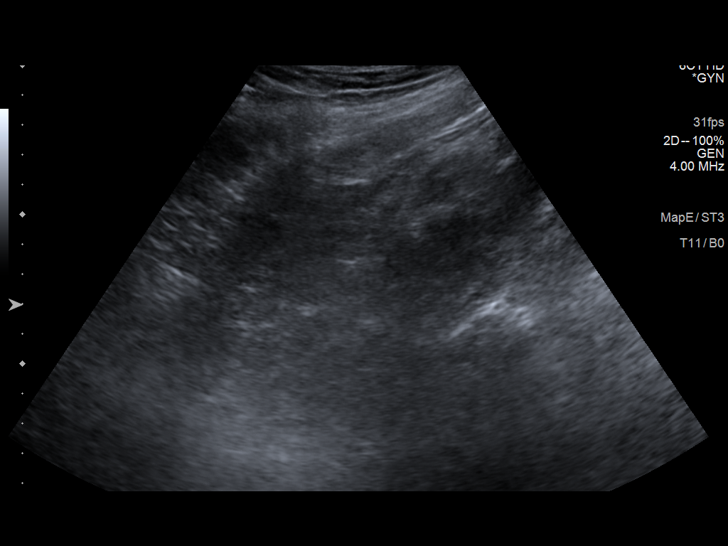
[im 39/93]
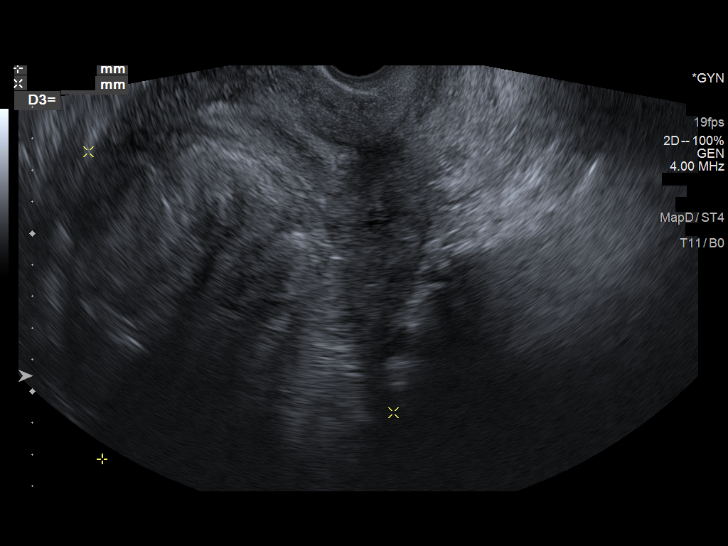
[im 47/93]
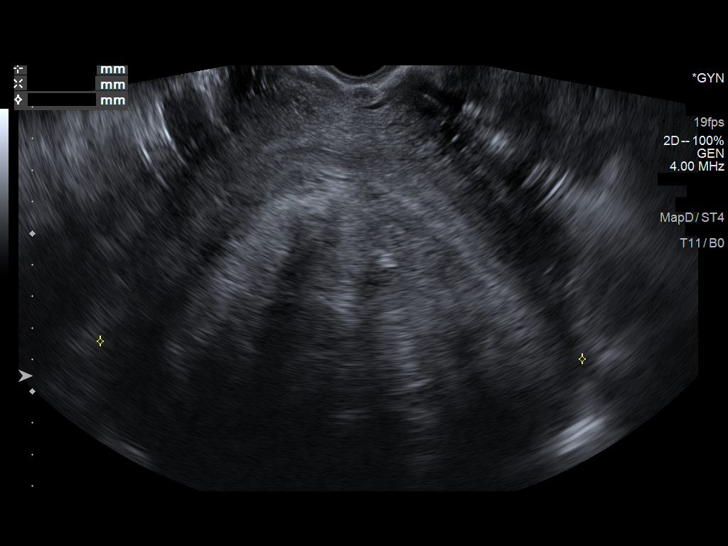
[im 54/93]
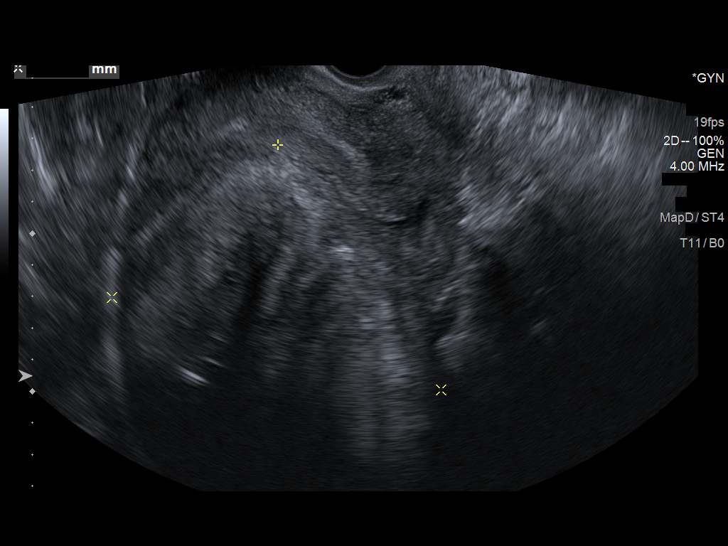
[im 62/93]
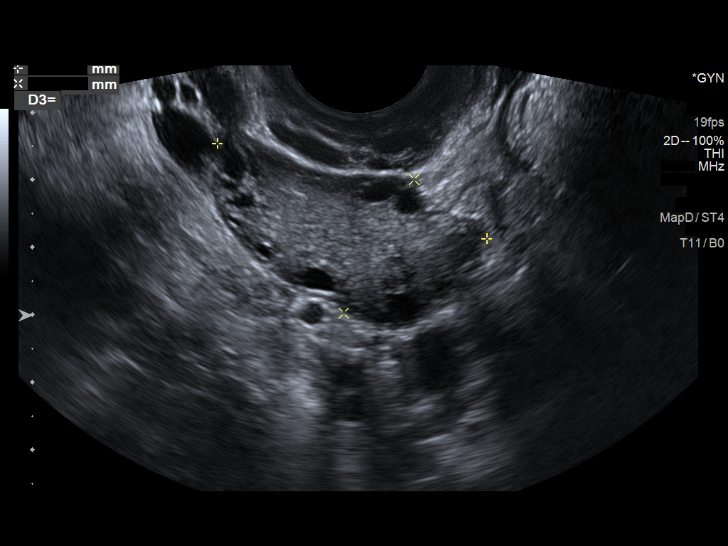
[im 70/93]
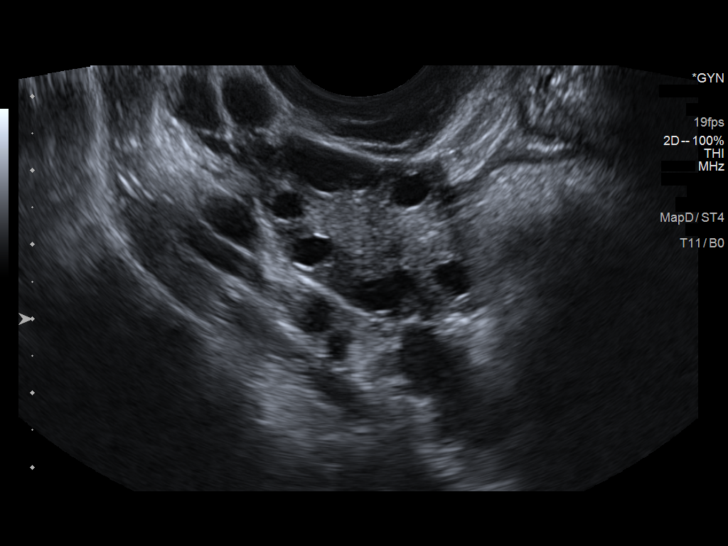
[im 77/93]
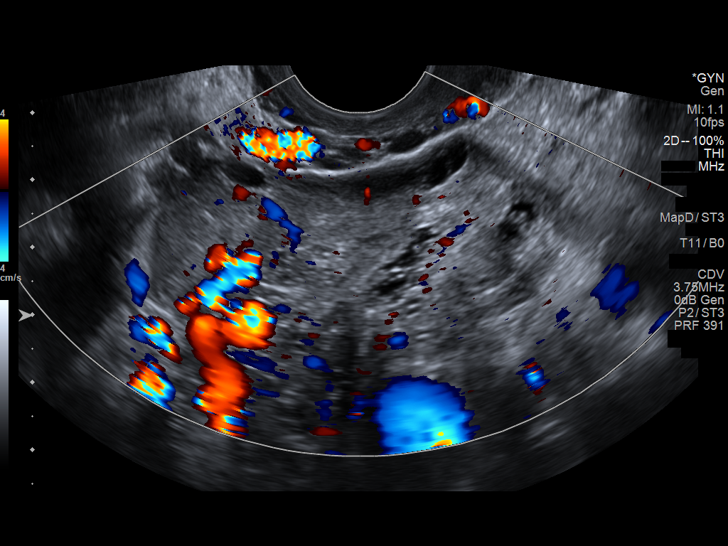
[im 85/93]
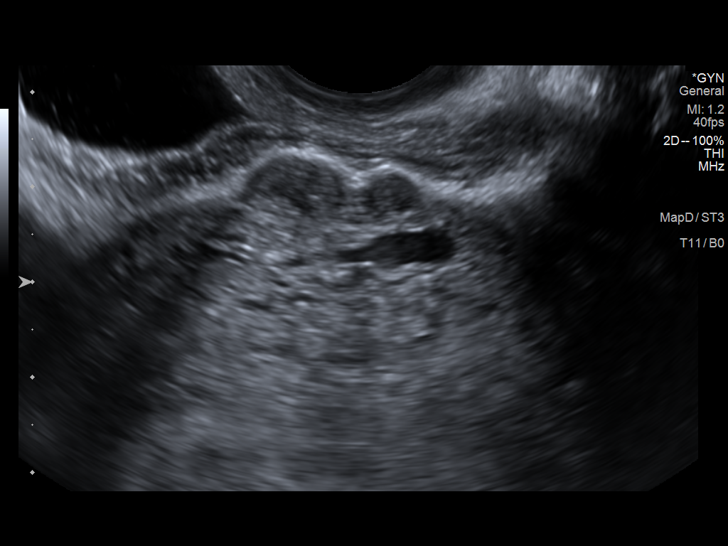
[im 93/93]
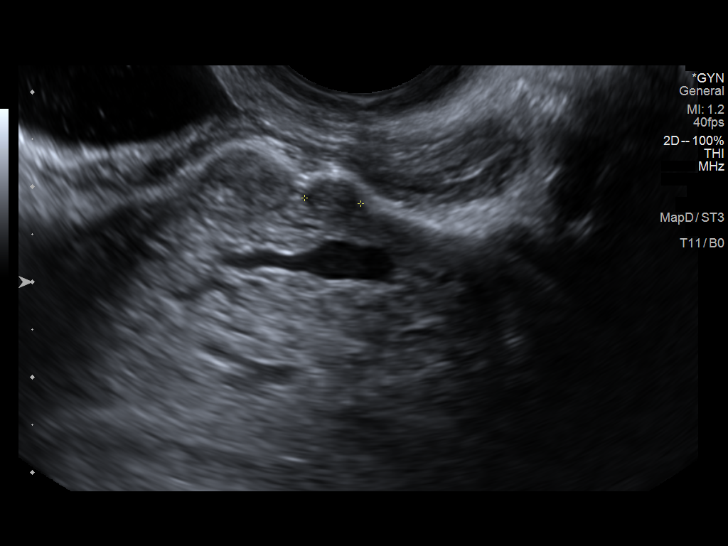

[13 of 25 positions shown; findings below may reference images not displayed]

FINDINGS: Uterus

Measurements: 18.9 x 13.3 x 14.9 cm = volume: 3485 mL. Large mass at
posterior upper uterine segment, poorly visualized due to size and
position, likely representing a large leiomyoma. This contains a few
echogenic foci likely calcifications and measures 13.0 x 11.0 x
cm in size. This leiomyoma extends submucosal at the posterior
aspect of the upper uterine endometrial complex. Tiny exophytic
leiomyomata are seen at the anterior wall of the uterus measuring 12
mm and 8 mm in size.

Endometrium

On cine series, endometrial complex is demonstrated, 10 mm thick
without endometrial fluid.

Right ovary

Measurements: 4.2 x 2.2 x 2.9 cm = volume: 15 mL. Normal morphology
without mass

Left ovary

Measurements: 5.8 x 2.4 x 3.0 cm = volume: 22 mL. Normal morphology
without mass

Other findings

Trace free pelvic fluid.  No adnexal masses.
IMPRESSION: Large transmural leiomyoma at posterior upper uterus 13.5 cm in
greatest size, significantly enlarging the uterus and extending
submucosal.

Normal thickness endometrial complex 10 mm thick without endometrial
fluid or obvious mass.

Normal appearing ovaries.

## 2020-10-18 IMAGING — US US PELVIS COMPLETE
1 series · 13 of 25 positions shown · non-contrast
Comparison: None

CLINICAL DATA: Enlarged uterus on exam, anemia, menometrorrhagia

EXAM:
TRANSABDOMINAL AND TRANSVAGINAL ULTRASOUND OF PELVIS
TECHNIQUE: Both transabdominal and transvaginal ultrasound examinations of the
pelvis were performed. Transabdominal technique was performed for
global imaging of the pelvis including uterus, ovaries, adnexal
regions, and pelvic cul-de-sac. It was necessary to proceed with
endovaginal exam following the transabdominal exam to visualize the
endometrium and ovaries.

[Series 1: us pelvis complete · 0.18mm/px · 13 of 93 slices shown]
[im 1/93]
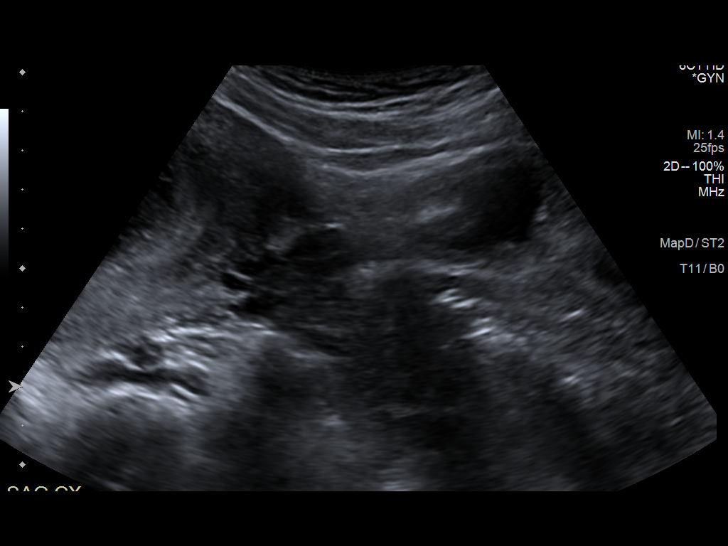
[im 8/93]
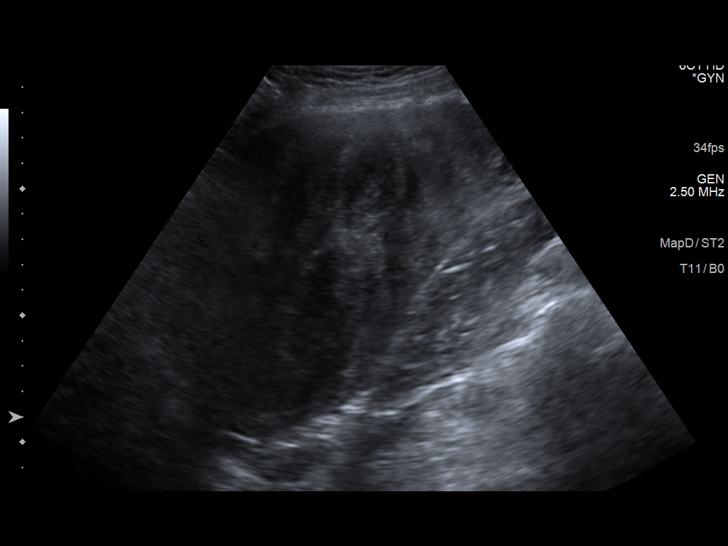
[im 16/93]
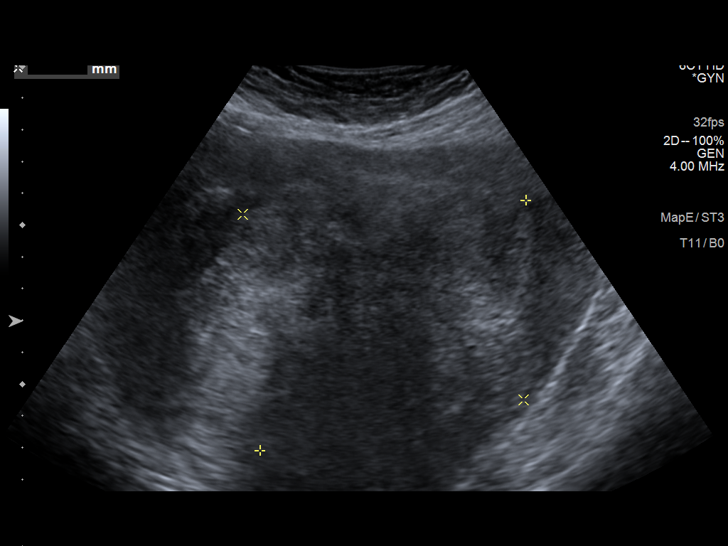
[im 24/93]
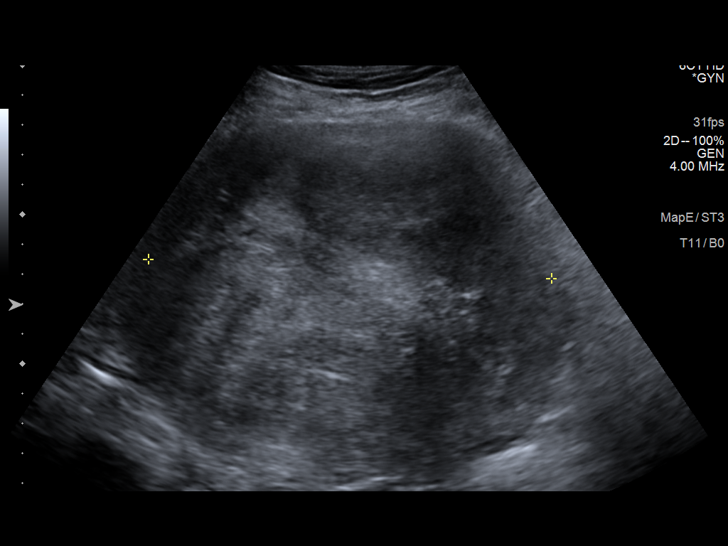
[im 31/93]
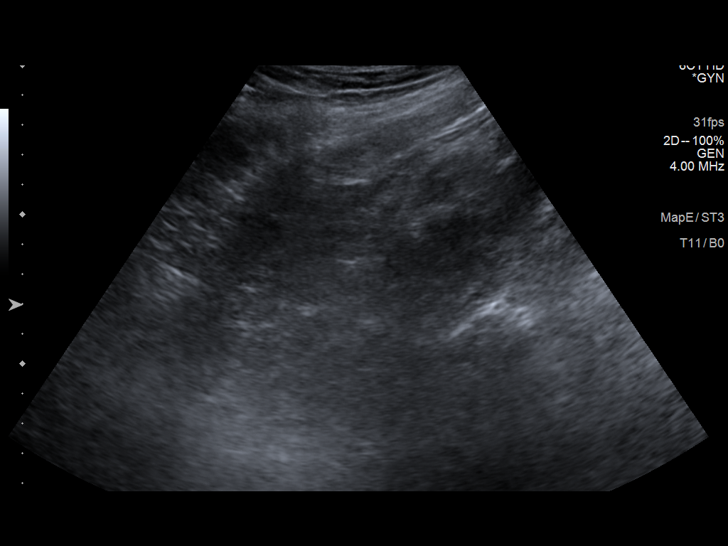
[im 39/93]
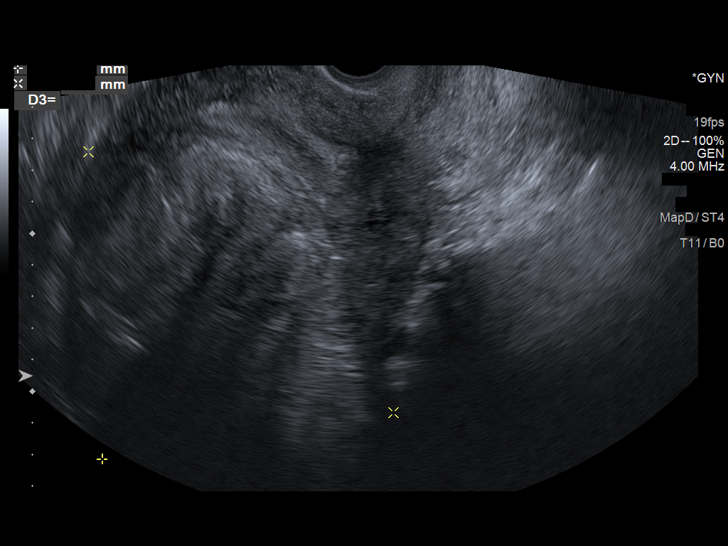
[im 47/93]
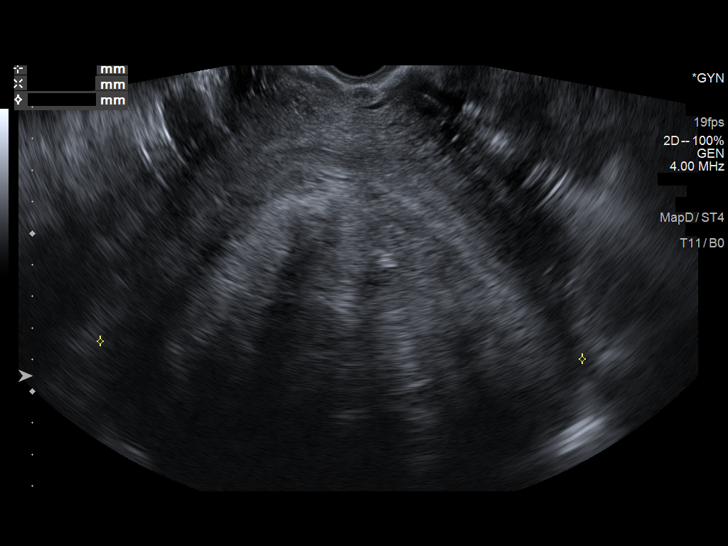
[im 54/93]
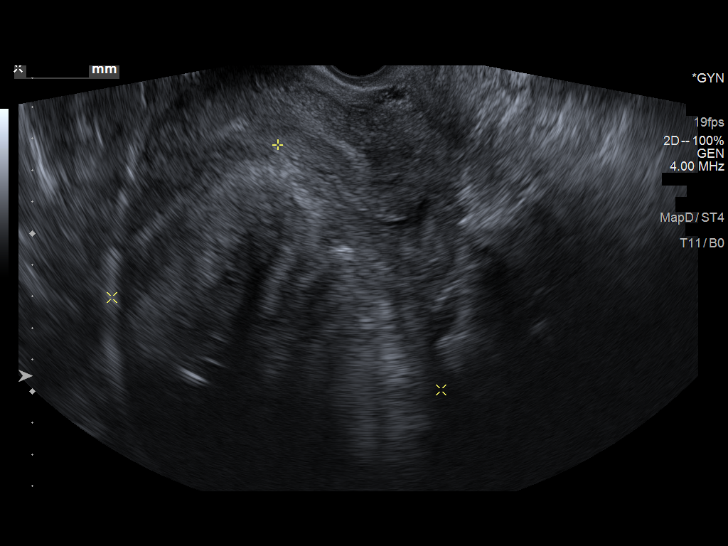
[im 62/93]
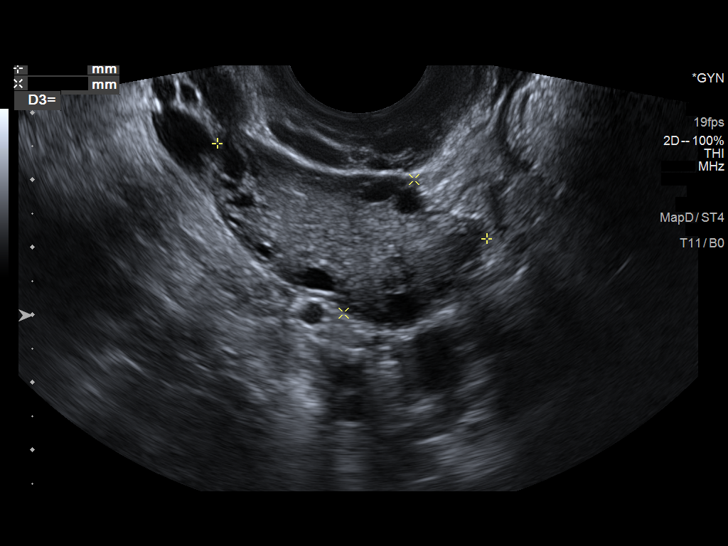
[im 70/93]
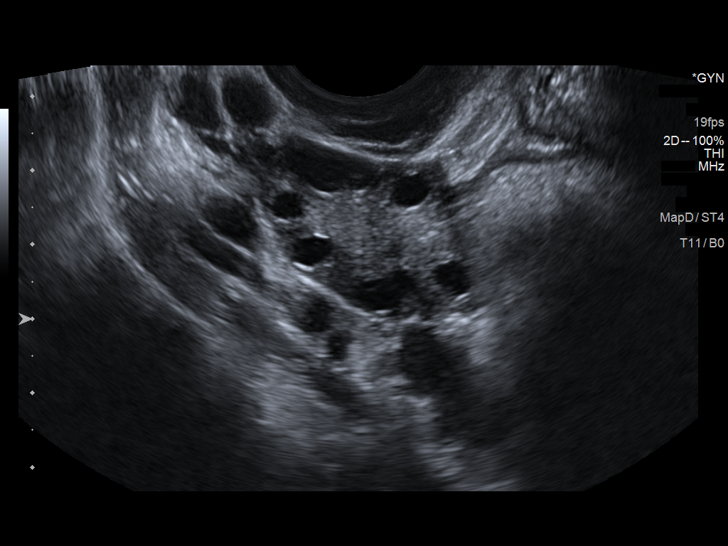
[im 77/93]
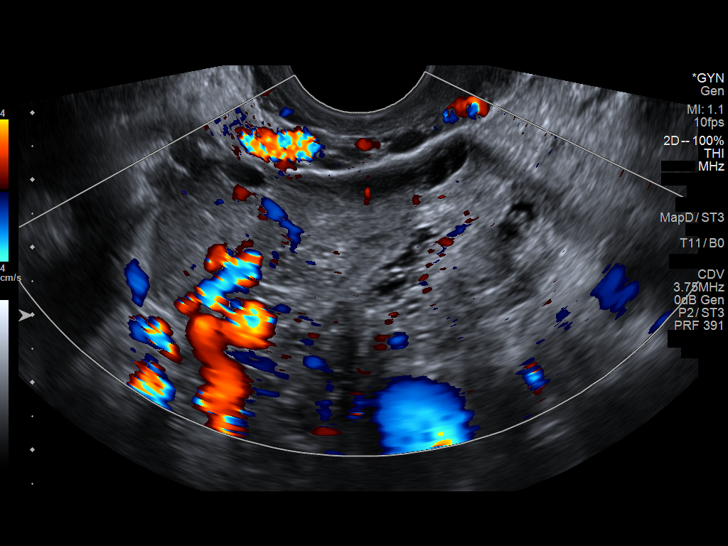
[im 85/93]
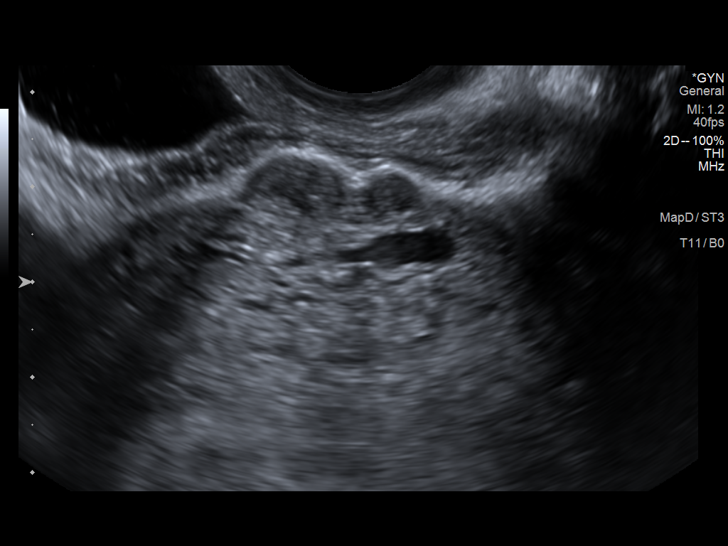
[im 93/93]
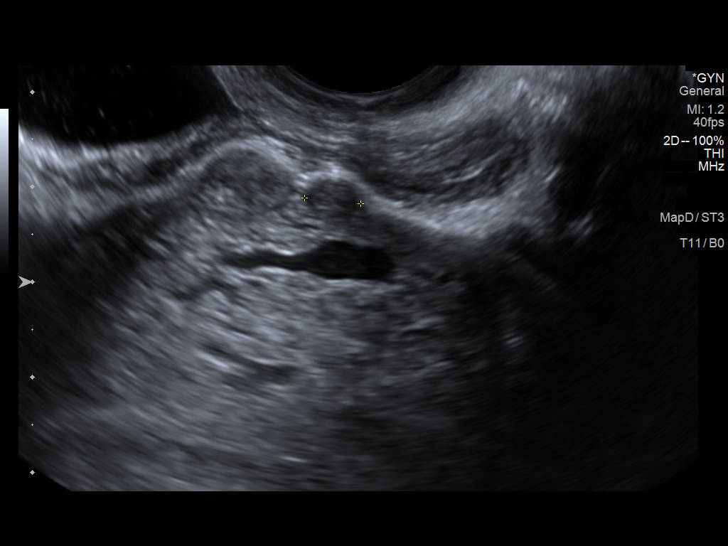

[13 of 25 positions shown; findings below may reference images not displayed]

FINDINGS: Uterus

Measurements: 18.9 x 13.3 x 14.9 cm = volume: 3485 mL. Large mass at
posterior upper uterine segment, poorly visualized due to size and
position, likely representing a large leiomyoma. This contains a few
echogenic foci likely calcifications and measures 13.0 x 11.0 x
cm in size. This leiomyoma extends submucosal at the posterior
aspect of the upper uterine endometrial complex. Tiny exophytic
leiomyomata are seen at the anterior wall of the uterus measuring 12
mm and 8 mm in size.

Endometrium

On cine series, endometrial complex is demonstrated, 10 mm thick
without endometrial fluid.

Right ovary

Measurements: 4.2 x 2.2 x 2.9 cm = volume: 15 mL. Normal morphology
without mass

Left ovary

Measurements: 5.8 x 2.4 x 3.0 cm = volume: 22 mL. Normal morphology
without mass

Other findings

Trace free pelvic fluid.  No adnexal masses.
IMPRESSION: Large transmural leiomyoma at posterior upper uterus 13.5 cm in
greatest size, significantly enlarging the uterus and extending
submucosal.

Normal thickness endometrial complex 10 mm thick without endometrial
fluid or obvious mass.

Normal appearing ovaries.

## 2021-10-30 ENCOUNTER — Ambulatory Visit
Admission: RE | Admit: 2021-10-30 | Discharge: 2021-10-30 | Disposition: A | Discharge status: Home / Self Care | Payer: Managed Care, Other (non HMO) | Source: Ambulatory Visit | Attending: Family Medicine | Admitting: Family Medicine

## 2021-10-30 VITALS — BP 142/90 | HR 91 | Temp 98.9°F | Resp 18

## 2021-10-30 DIAGNOSIS — K029 Dental caries, unspecified: Secondary | ICD-10-CM | POA: Diagnosis not present

## 2021-10-30 DIAGNOSIS — R030 Elevated blood-pressure reading, without diagnosis of hypertension: Secondary | ICD-10-CM | POA: Diagnosis not present

## 2021-10-30 MED ORDER — AMOXICILLIN-POT CLAVULANATE 875-125 MG PO TABS: 1.0000 | ORAL_TABLET | Freq: Two times a day (BID) | ORAL | 0 refills | Status: AC

## 2021-10-30 NOTE — ED Triage Notes (Signed)
Pt c/o dental pain x 1 week. Has an abscess she noticed on top LT. Taking ibuprofen prn.

## 2021-10-30 NOTE — Discharge Instructions (Addendum)
Advised patient to take medication as directed with food to completion.  Encouraged patient to increase daily water intake while taking this medication.  Advised patient to follow-up with a dentist ASAP for further evaluation.  Advised patient to check blood pressure each morning prior to eating and to log measurements so that PCP can better evaluate daily blood pressure trends.  Advised patient if symptoms worsen and/or unresolved please follow-up with PCP or here for further evaluation.

## 2021-10-30 NOTE — ED Provider Notes (Signed)
[facility]    CSN: [id number] Arrival date & time: 10/30/21  8250      History   Chief Complaint Chief Complaint  Patient presents with   Abscess    Top LT   Dental Pain    HPI Grace Robertson is a 41 y.o. female.   HPI 41 year old female presents with dental pain and possible dental abscess of left top for 1 week.  PMH significant for obesity and iron deficiency anemia due to chronic blood loss.  Past Medical History:  Diagnosis Date   Heart murmur    as baby   Injury of fifth finger, left 08/14/2013   Iron deficiency anemia due to chronic blood loss     Patient Active Problem List   Diagnosis Date Noted   S/P TAH (total abdominal hysterectomy) for fibroids, AUB, anemia 09/04/2019   IDA (iron deficiency anemia) 03/06/2019    Past Surgical History:  Procedure Laterality Date   HYSTERECTOMY ABDOMINAL WITH SALPINGECTOMY Bilateral 09/04/2019   Procedure: HYSTERECTOMY ABDOMINAL WITH SALPINGECTOMY;  Surgeon: Osborne Oman, MD;  Location: [facility];  Service: Gynecology;  Laterality: Bilateral;  REQUESTING TAP BLOCK   OPEN REDUCTION INTERNAL FIXATION (ORIF) METACARPAL Left 08/15/2013   Procedure: OPEN REDUCTION INTERNAL FIXATION (ORIF) LEFT SMALL FINGER;  Surgeon: Jolyn Nap, MD;  Location: [facility];  Service: Orthopedics;  Laterality: Left;    OB History     Gravida  0   Para  0   Term  0   Preterm  0   AB  0   Living  0      SAB  0   IAB  0   Ectopic  0   Multiple  0   Live Births  0            Home Medications    Prior to Admission medications   Medication Sig Start Date End Date Taking? Authorizing Provider  amoxicillin-clavulanate (AUGMENTIN) 875-125 MG tablet Take 1 tablet by mouth every 12 (twelve) hours for 10 days. 10/30/21 11/09/21 Yes Eliezer Lofts, FNP    Family History Family History  Problem Relation Age of Onset   High blood pressure Father    High blood pressure Paternal Grandmother     Diabetes Paternal Grandmother    Stroke Paternal Grandmother     Social History Social History   Tobacco Use   Smoking status: Never   Smokeless tobacco: Never  Vaping Use   Vaping Use: Never used  Substance Use Topics   Alcohol use: Yes    Alcohol/week: 1.0 - 2.0 standard drink of alcohol    Types: 1 - 2 Standard drinks or equivalent per week    Comment: social   Drug use: No     Allergies   Patient has no known allergies.   Review of Systems Review of Systems  HENT:  Positive for dental problem.   All other systems reviewed and are negative.    Physical Exam Triage Vital Signs ED Triage Vitals [10/30/21 0835]  Enc Vitals Group     BP (!) 167/118     Pulse Rate 91     Resp 18     Temp 98.9 F (37.2 C)     Temp Source Oral     SpO2 100 %     Weight      Height      Head Circumference      Peak Flow      Pain  Score 2     Pain Loc      Pain Edu?      Excl. in [place]?    No data found.  Updated Vital Signs BP (!) 142/90 (BP Location: Right Arm)   Pulse 91   Temp 98.9 F (37.2 C) (Oral)   Resp 18   LMP 08/24/2019   SpO2 100%     Physical Exam Vitals and nursing note reviewed.  Constitutional:      General: She is not in acute distress.    Appearance: Normal appearance. She is obese. She is not ill-appearing.  HENT:     Head: Normocephalic and atraumatic.     Nose: Nose normal.     Mouth/Throat:     Mouth: Mucous membranes are moist.     Pharynx: Oropharynx is clear.     Comments: Upper left: Second molar erythematous medial and gingival border noted Eyes:     Extraocular Movements: Extraocular movements intact.     Conjunctiva/sclera: Conjunctivae normal.     Pupils: Pupils are equal, round, and reactive to light.  Cardiovascular:     Rate and Rhythm: Normal rate and regular rhythm.     Pulses: Normal pulses.     Heart sounds: Normal heart sounds.     Comments: Hypertensive Pulmonary:     Effort: Pulmonary effort is normal.     Breath  sounds: Normal breath sounds. No wheezing, rhonchi or rales.  Musculoskeletal:     Cervical back: Normal range of motion and neck supple.  Skin:    General: Skin is warm and dry.  Neurological:     General: No focal deficit present.     Mental Status: She is alert and oriented to person, place, and time.      UC Treatments / Results  Labs (all labs ordered are listed, but only abnormal results are displayed) Labs Reviewed - No data to display  EKG   Radiology No results found.  Procedures Procedures (including critical care time)  Medications Ordered in UC Medications - No data to display  Initial Impression / Assessment and Plan / UC Course  I have reviewed the triage vital signs and the nursing notes.  Pertinent labs & imaging results that were available during my care of the patient were reviewed by me and considered in my medical decision making (see chart for details).     MDM: 1.  Pain due to dental caries-Rx'd Augmentin, Advised patient to take medication as directed with food to completion.  Encouraged patient to increase daily water intake while taking this medication.  Advised patient to follow-up with a dentist ASAP for further evaluation; 2.  Elevated blood pressure without diagnosis of hypertension-Advised patient to check blood pressure each morning prior to eating and to log measurements so that PCP can better evaluate daily blood pressure trends.  Advised patient if symptoms worsen and/or unresolved please follow-up with PCP or here for further evaluation.  Patient discharged home, hemodynamically stable. Final Clinical Impressions(s) / UC Diagnoses   Final diagnoses:  Pain due to dental caries  Elevated blood pressure reading in office without diagnosis of hypertension     Discharge Instructions      Advised patient to take medication as directed with food to completion.  Encouraged patient to increase daily water intake while taking this medication.   Advised patient to follow-up with a dentist ASAP for further evaluation.  Advised patient to check blood pressure each morning prior to eating and to log  measurements so that PCP can better evaluate daily blood pressure trends.  Advised patient if symptoms worsen and/or unresolved please follow-up with PCP or here for further evaluation.     ED Prescriptions     Medication Sig Dispense Auth. Provider   amoxicillin-clavulanate (AUGMENTIN) 875-125 MG tablet Take 1 tablet by mouth every 12 (twelve) hours for 10 days. 20 tablet Eliezer Lofts, FNP      PDMP not reviewed this encounter.   Eliezer Lofts, [place] 10/30/21 [phone]
